# Patient Record
Sex: Female | Born: 1960
Health system: Southern US, Community
[De-identification: ages and names within clinical notes are randomized; demographics above are authoritative.]

## PROBLEM LIST (undated history)

## (undated) DIAGNOSIS — I10 Essential (primary) hypertension: Secondary | ICD-10-CM

## (undated) DIAGNOSIS — Z923 Personal history of irradiation: Secondary | ICD-10-CM

## (undated) DIAGNOSIS — C801 Malignant (primary) neoplasm, unspecified: Secondary | ICD-10-CM

## (undated) DIAGNOSIS — Z9221 Personal history of antineoplastic chemotherapy: Secondary | ICD-10-CM

## (undated) DIAGNOSIS — E119 Type 2 diabetes mellitus without complications: Secondary | ICD-10-CM

## (undated) HISTORY — DX: Type 2 diabetes mellitus without complications: E11.9

## (undated) HISTORY — DX: Malignant (primary) neoplasm, unspecified: C80.1

## (undated) HISTORY — DX: Essential (primary) hypertension: I10

---

## 2007-08-17 ENCOUNTER — Ambulatory Visit: Payer: Self-pay | Admitting: Unknown Physician Specialty

## 2007-08-18 ENCOUNTER — Ambulatory Visit: Payer: Self-pay | Admitting: Unknown Physician Specialty

## 2007-08-20 ENCOUNTER — Ambulatory Visit: Payer: Self-pay | Admitting: Oncology

## 2007-08-26 ENCOUNTER — Ambulatory Visit: Payer: Self-pay | Admitting: General Surgery

## 2007-08-29 ENCOUNTER — Ambulatory Visit: Payer: Self-pay | Admitting: Oncology

## 2007-09-19 ENCOUNTER — Ambulatory Visit: Payer: Self-pay | Admitting: Oncology

## 2007-10-20 HISTORY — PX: BREAST LUMPECTOMY: SHX2

## 2008-04-18 ENCOUNTER — Ambulatory Visit: Payer: Self-pay | Admitting: Oncology

## 2008-09-14 IMAGING — CR DG CHEST 2V
1 series · 2 of 2 positions shown · non-contrast
Comparison: none

REASON FOR EXAM: breast cancer
COMMENTS:

[Series 1: view not recorded · 0.17mm/px · 2 of 2 slices shown]
[im 1/2]
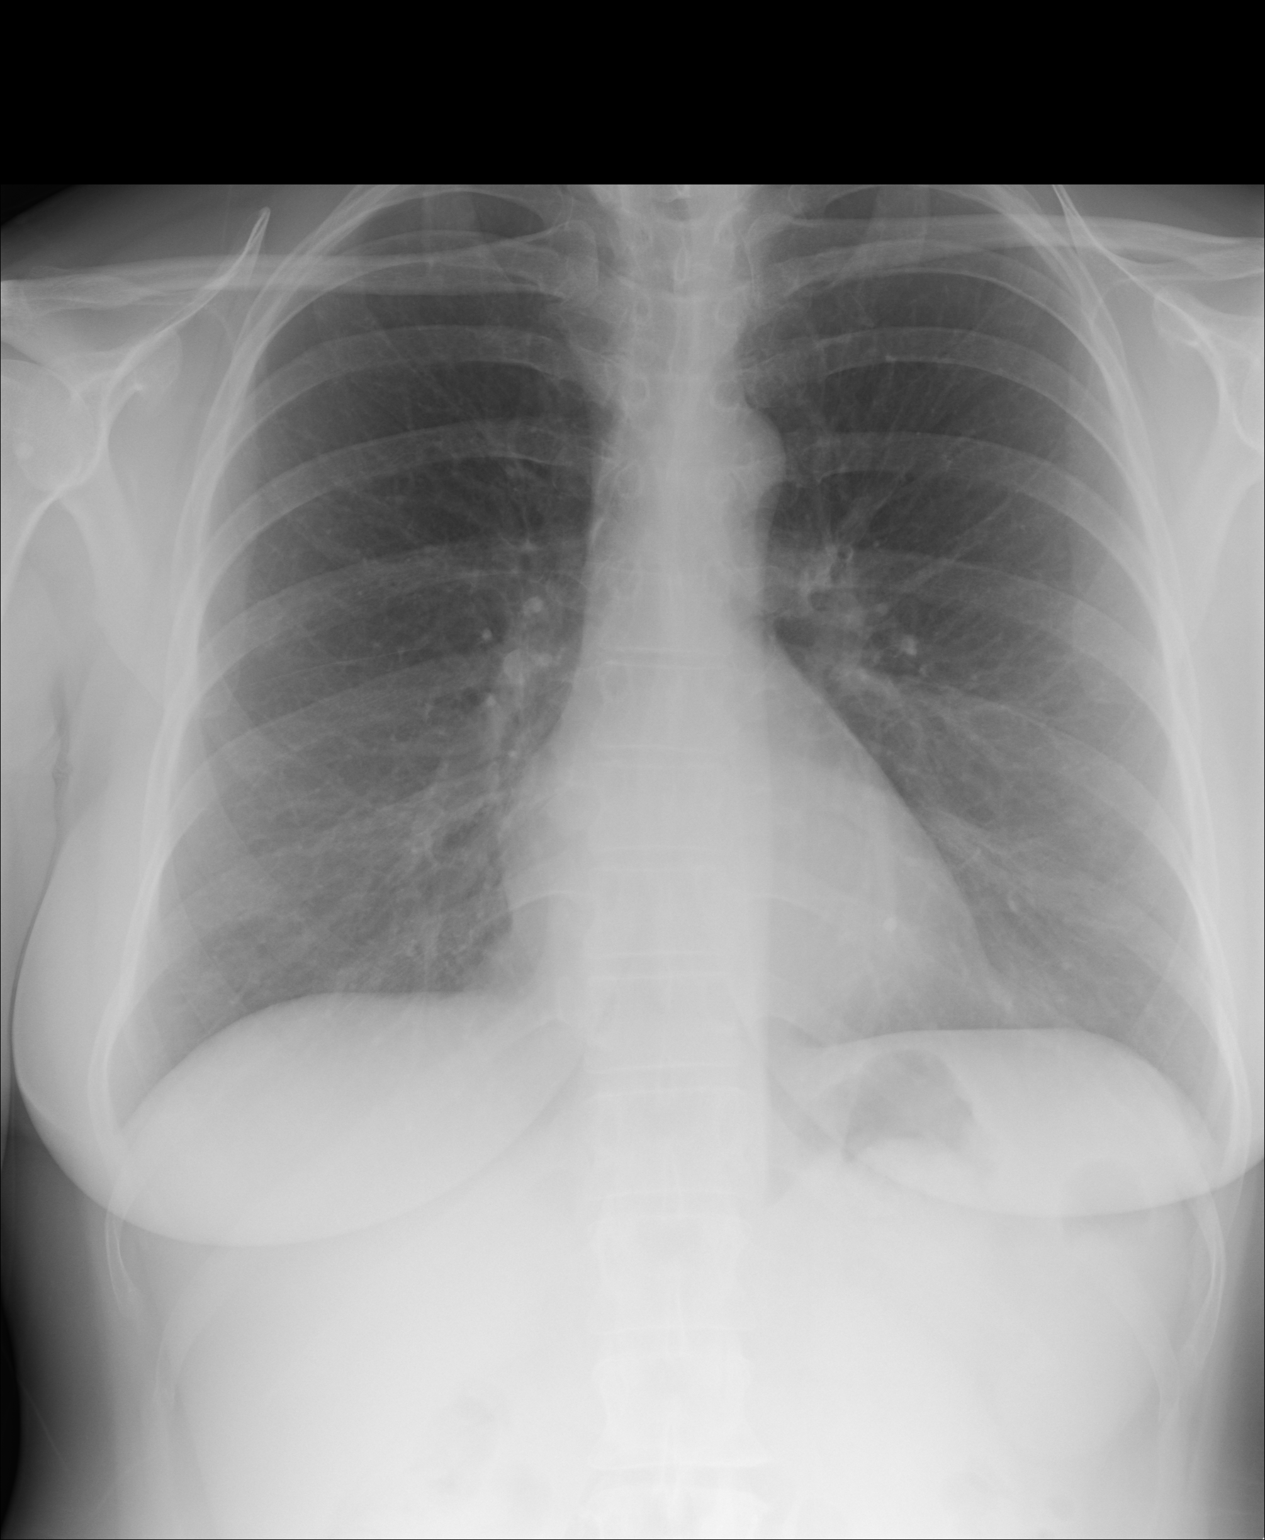
[im 2/2]
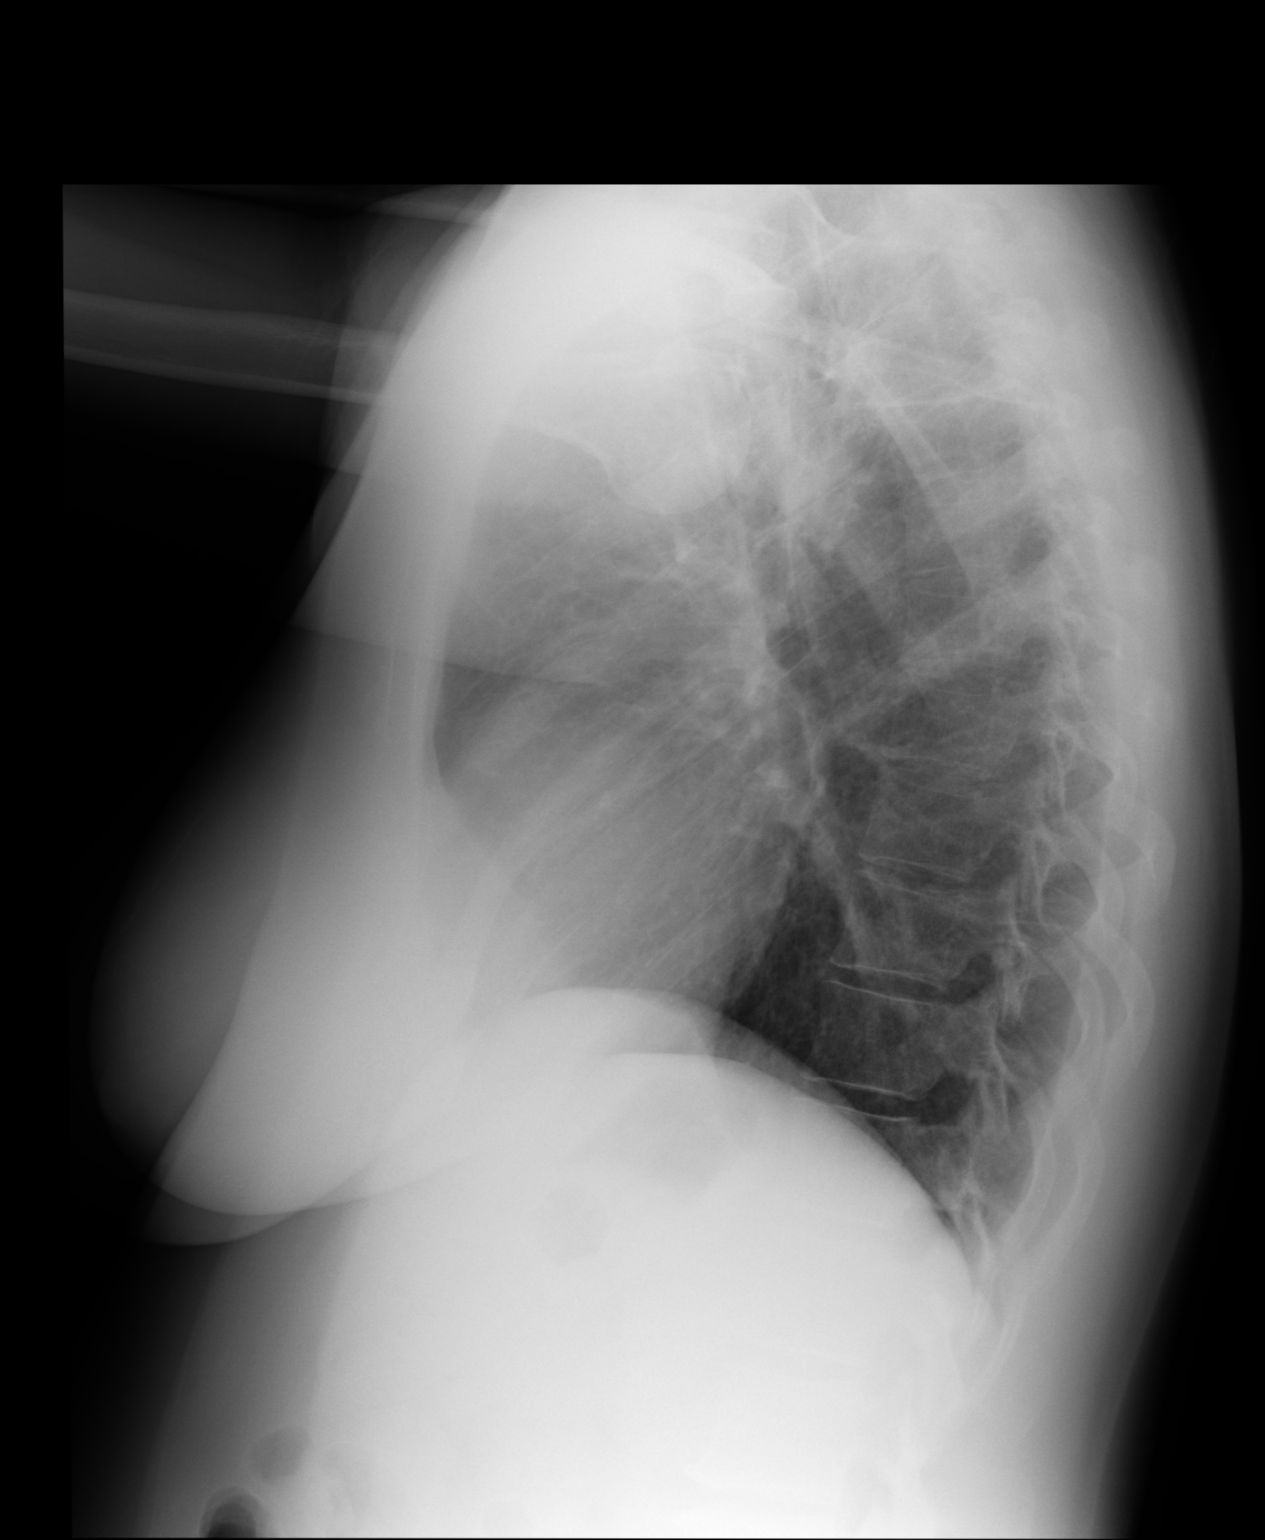

[2 of 2 positions shown; findings below may reference images not displayed]

PROCEDURE:     KDR - KDXR CHEST PA (OR AP) AND LAT  - August 26, 2007  [DATE]

RESULT:     There are no prior exams for comparison. The lungs are clear.
The heart and pulmonary vessels are normal. The bony and mediastinal
structures are unremarkable. There is no effusion. There is no pneumothorax
or evidence of congestive failure.
IMPRESSION: No acute cardiopulmonary disease.

## 2008-09-21 IMAGING — CT CT CHEST-ABD-PELV W/ CM
1 of 2 series · 14 of 29 positions shown, 19 images · non-contrast
Comparison: none

REASON FOR EXAM: Breast CA
COMMENTS:

[Series 2: soft tissue · axial · 0.77mm/px · z∈[-105,+480]mm · 14 of 133 slices shown, 19 images]
[im 8/133  mediastinal]
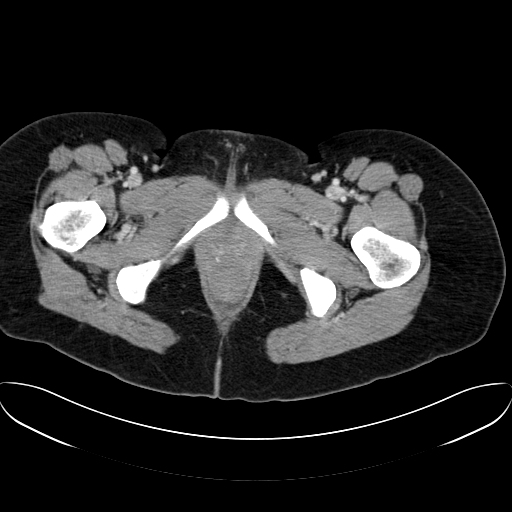
[im 8/133  bone]
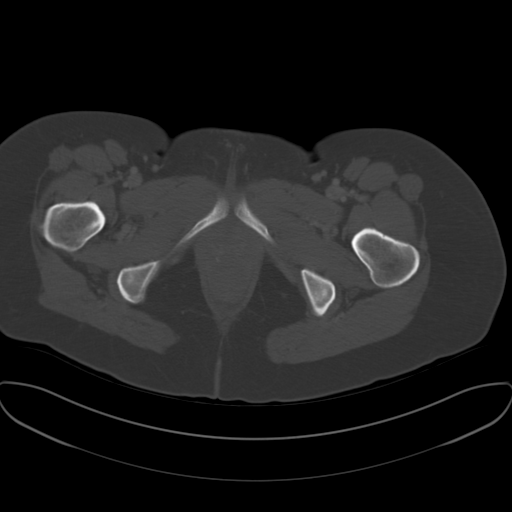
[im 23/133  mediastinal]
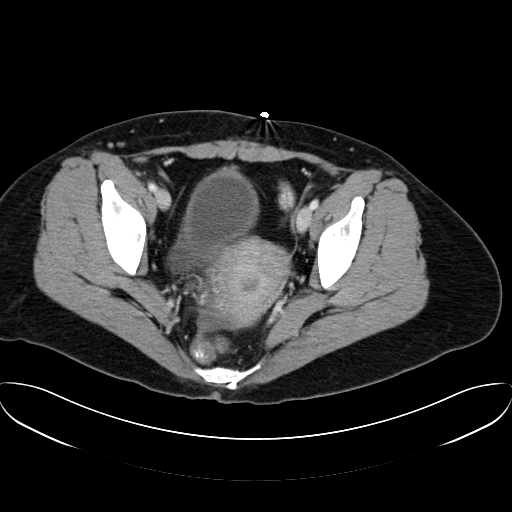
[im 30/133  mediastinal]
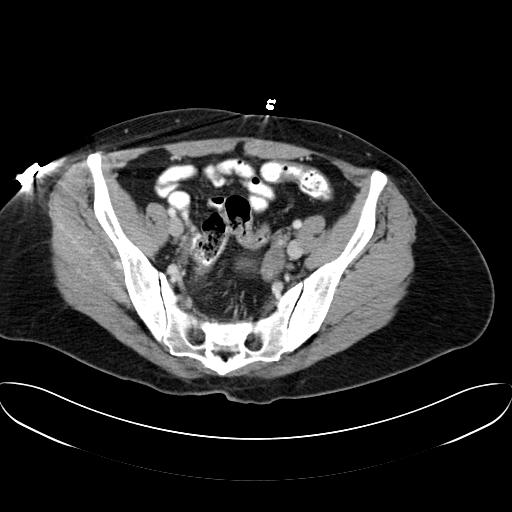
[im 37/133  mediastinal]
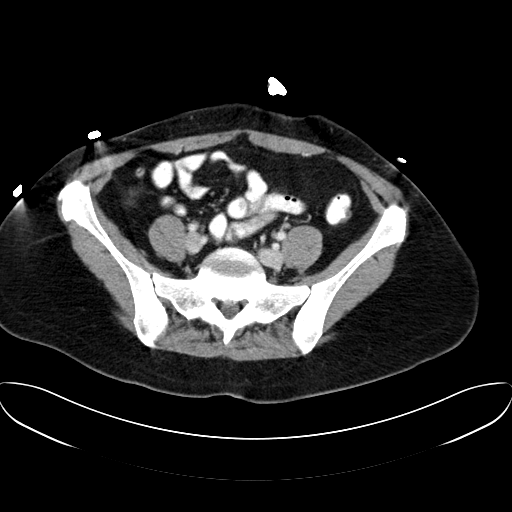
[im 52/133  mediastinal]
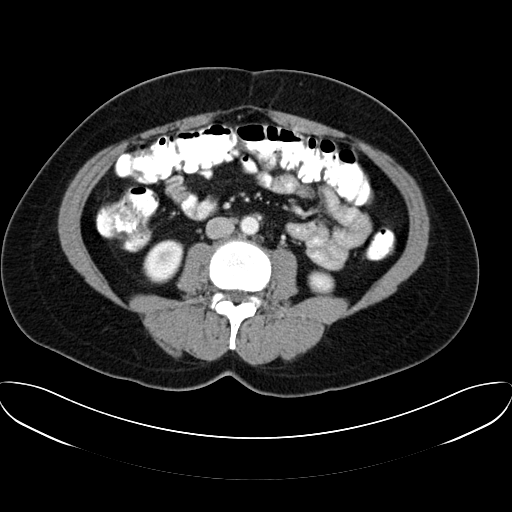
[im 59/133  mediastinal]
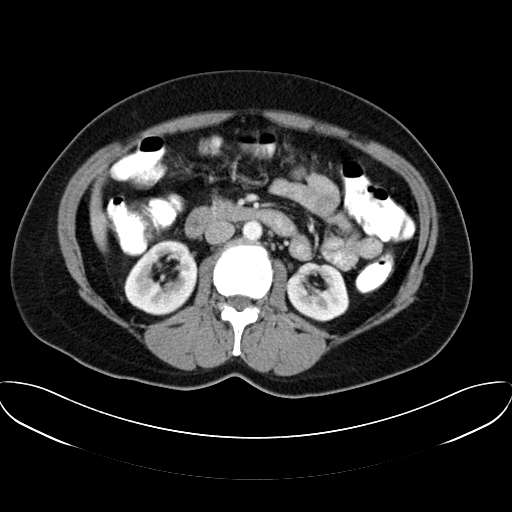
[im 67/133  mediastinal]
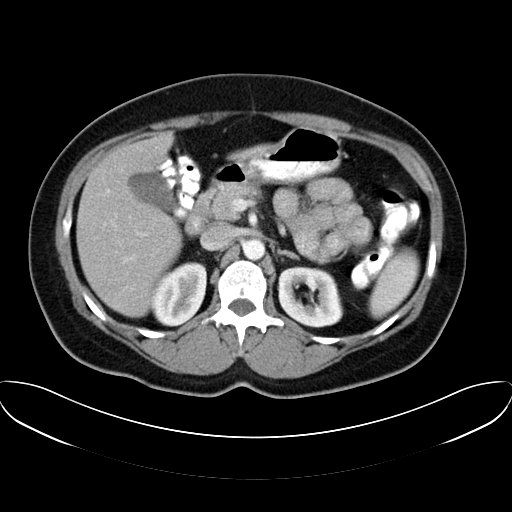
[im 74/133  mediastinal]
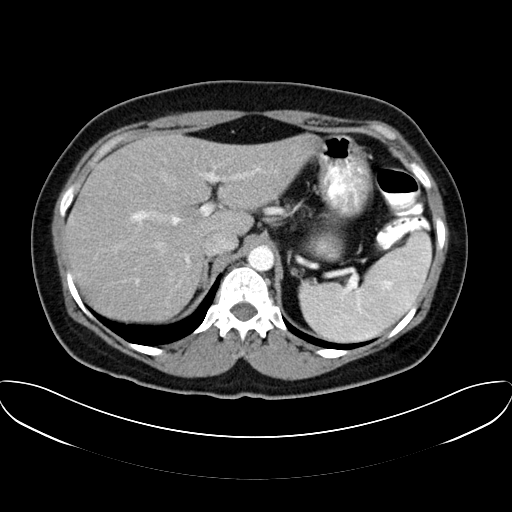
[im 81/133  mediastinal]
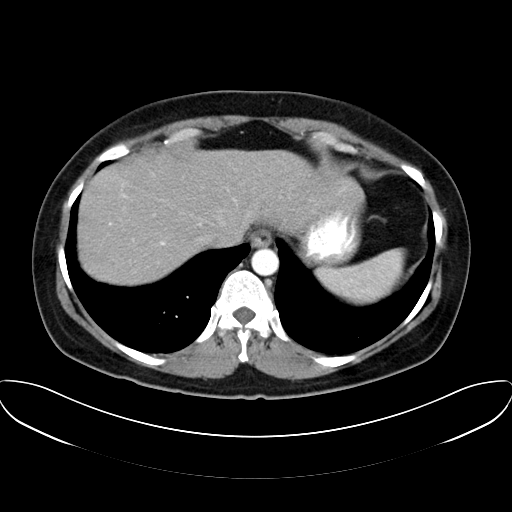
[im 81/133  bone]
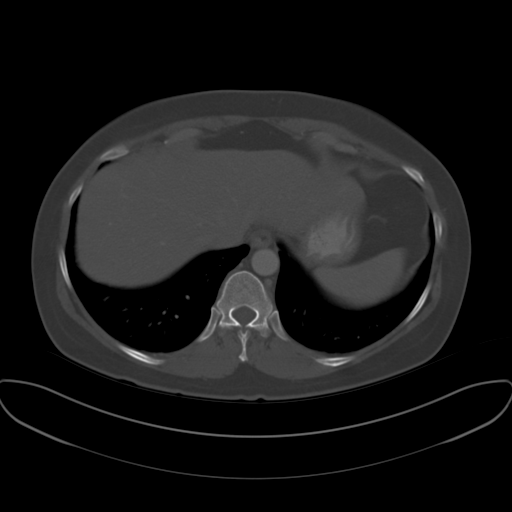
[im 96/133  mediastinal]
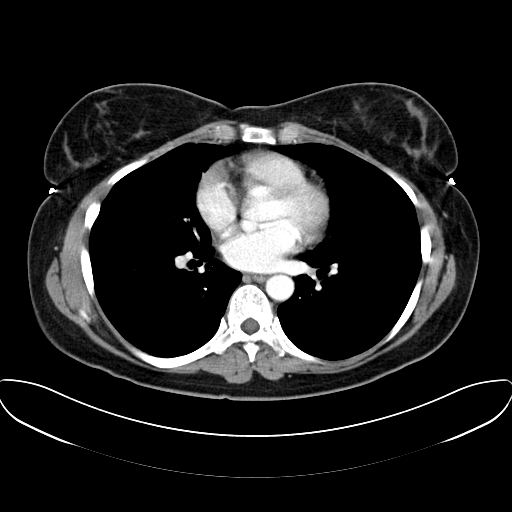
[im 103/133  mediastinal]
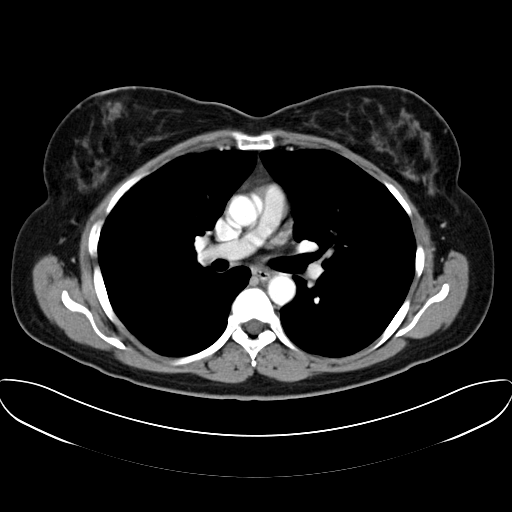
[im 103/133  lung]
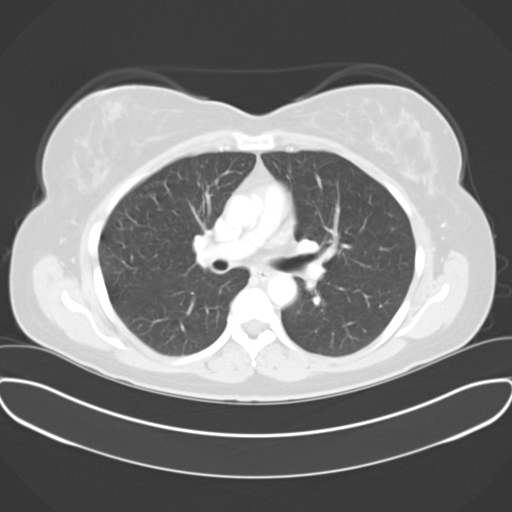
[im 111/133  mediastinal]
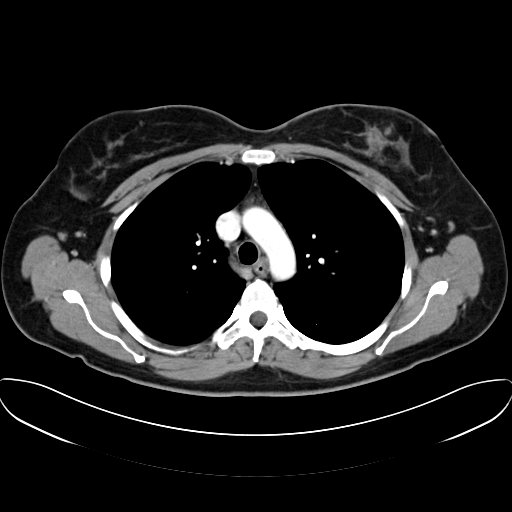
[im 111/133  lung]
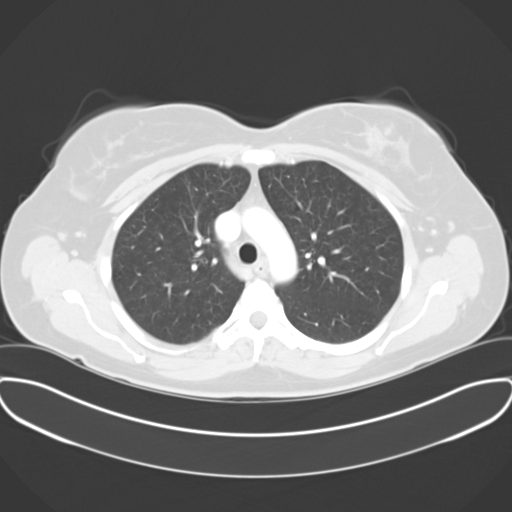
[im 118/133  lung]
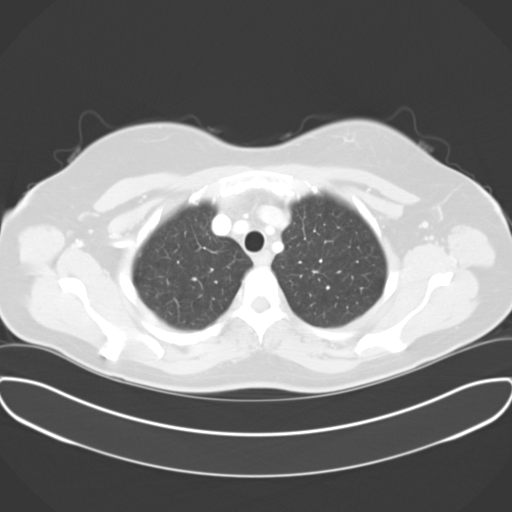
[im 125/133  mediastinal]
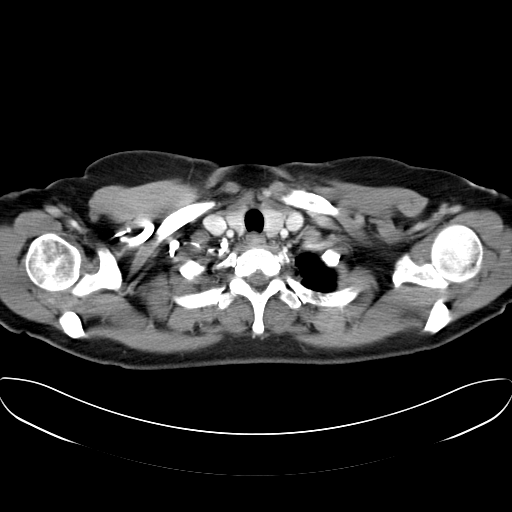
[im 125/133  lung]
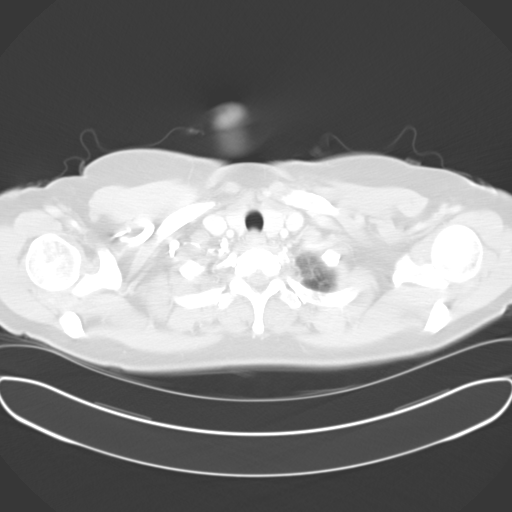

[14 of 29 positions shown; findings below may reference images not displayed]

PROCEDURE:     CT  - CT CHEST ABDOMEN AND PELVIS W  - September 02, 2007  [DATE]

RESULT:     CT of the chest, abdomen, and pelvis is performed utilizing 100
ml of Ksovue-1BU. Images are reconstructed at 5 mm slice thickness in the
axial plane. The patient has no prior exam for comparison.

There is nodular density in the left breast upper mid to medial aspect
measuring approximately 2.3 to 2.4 cm in diameter. The patient has known
breast cancer. This could be postoperative change or primary malignancy.
There is no axillary, mediastinal or hilar mass or adenopathy. There is no
pleural or pericardial effusion. There is no supraclavicular adenopathy. The
lungs are clear with no evidence of a focal pulmonary parenchymal mass.
There is no infiltrate or bronchial thickening. There is no pneumothorax.

There is no focal splenic lesion. The spleen is normal in size. There is a
focal area of low attenuation within the liver anteriorly on image #61
measuring approximately 5 mm and suggestive of a possible cyst. The liver
otherwise is unremarkable. There are no radiopaque gallstones evident. The
kidneys and adrenal glands appear to enhance normally with no masses
evident. The thoracic and abdominal aorta are normal in caliber. There is no
evidence of an abnormal area of bowel distention, free air or free fluid. No
fluid collection is present. The uterus is present and appears to be within
normal limits for size. The appendix is seen and appears normal. No
mesenteric or retroperitoneal adenopathy is evident.
IMPRESSION: 1. Probable hepatic cyst.
2. Density in the left breast as described. No definite metastatic disease
is demonstrated.

## 2008-10-19 HISTORY — PX: BREAST SURGERY: SHX581

## 2009-04-16 ENCOUNTER — Ambulatory Visit: Payer: Self-pay | Admitting: Internal Medicine

## 2010-07-15 ENCOUNTER — Ambulatory Visit: Payer: Self-pay | Admitting: Internal Medicine

## 2011-10-02 ENCOUNTER — Ambulatory Visit: Payer: Self-pay

## 2012-03-15 DIAGNOSIS — J310 Chronic rhinitis: Secondary | ICD-10-CM | POA: Insufficient documentation

## 2012-03-15 DIAGNOSIS — H7390 Unspecified disorder of tympanic membrane, unspecified ear: Secondary | ICD-10-CM | POA: Insufficient documentation

## 2012-03-15 DIAGNOSIS — H9190 Unspecified hearing loss, unspecified ear: Secondary | ICD-10-CM | POA: Insufficient documentation

## 2012-03-15 HISTORY — DX: Chronic rhinitis: J31.0

## 2012-03-15 HISTORY — DX: Unspecified disorder of tympanic membrane, unspecified ear: H73.90

## 2012-03-15 HISTORY — DX: Unspecified hearing loss, unspecified ear: H91.90

## 2012-04-25 DIAGNOSIS — H900 Conductive hearing loss, bilateral: Secondary | ICD-10-CM | POA: Insufficient documentation

## 2012-04-25 DIAGNOSIS — H8110 Benign paroxysmal vertigo, unspecified ear: Secondary | ICD-10-CM

## 2012-04-25 DIAGNOSIS — H6990 Unspecified Eustachian tube disorder, unspecified ear: Secondary | ICD-10-CM

## 2012-04-25 HISTORY — DX: Benign paroxysmal vertigo, unspecified ear: H81.10

## 2012-04-25 HISTORY — DX: Unspecified eustachian tube disorder, unspecified ear: H69.90

## 2012-04-25 HISTORY — DX: Conductive hearing loss, bilateral: H90.0

## 2012-06-17 DIAGNOSIS — C50912 Malignant neoplasm of unspecified site of left female breast: Secondary | ICD-10-CM

## 2012-06-17 HISTORY — DX: Malignant neoplasm of unspecified site of left female breast: C50.912

## 2012-11-15 DIAGNOSIS — C50919 Malignant neoplasm of unspecified site of unspecified female breast: Secondary | ICD-10-CM | POA: Insufficient documentation

## 2012-11-15 HISTORY — DX: Malignant neoplasm of unspecified site of unspecified female breast: C50.919

## 2012-11-18 DIAGNOSIS — L409 Psoriasis, unspecified: Secondary | ICD-10-CM

## 2012-11-18 HISTORY — DX: Psoriasis, unspecified: L40.9

## 2013-07-27 DIAGNOSIS — Z1371 Encounter for nonprocreative screening for genetic disease carrier status: Secondary | ICD-10-CM

## 2013-07-27 HISTORY — DX: Encounter for nonprocreative screening for genetic disease carrier status: Z13.71

## 2014-04-12 ENCOUNTER — Ambulatory Visit: Payer: Self-pay | Admitting: Internal Medicine

## 2014-04-18 ENCOUNTER — Ambulatory Visit: Payer: Self-pay | Admitting: Internal Medicine

## 2017-10-07 ENCOUNTER — Encounter: Payer: Self-pay | Admitting: Nurse Practitioner

## 2017-10-08 ENCOUNTER — Encounter: Payer: Self-pay | Admitting: Nurse Practitioner

## 2017-10-15 ENCOUNTER — Encounter: Payer: Self-pay | Admitting: Nurse Practitioner

## 2017-10-15 ENCOUNTER — Ambulatory Visit: Payer: 59 | Admitting: Nurse Practitioner

## 2017-10-15 VITALS — BP 130/86 | HR 91 | Resp 16 | Ht 66.0 in | Wt 158.2 lb

## 2017-10-15 DIAGNOSIS — Z9119 Patient's noncompliance with other medical treatment and regimen: Secondary | ICD-10-CM

## 2017-10-15 DIAGNOSIS — Z853 Personal history of malignant neoplasm of breast: Secondary | ICD-10-CM

## 2017-10-15 DIAGNOSIS — M545 Low back pain, unspecified: Secondary | ICD-10-CM | POA: Insufficient documentation

## 2017-10-15 DIAGNOSIS — R197 Diarrhea, unspecified: Secondary | ICD-10-CM | POA: Insufficient documentation

## 2017-10-15 DIAGNOSIS — R5383 Other fatigue: Secondary | ICD-10-CM | POA: Insufficient documentation

## 2017-10-15 DIAGNOSIS — E113213 Type 2 diabetes mellitus with mild nonproliferative diabetic retinopathy with macular edema, bilateral: Secondary | ICD-10-CM

## 2017-10-15 DIAGNOSIS — E785 Hyperlipidemia, unspecified: Secondary | ICD-10-CM

## 2017-10-15 DIAGNOSIS — E1165 Type 2 diabetes mellitus with hyperglycemia: Secondary | ICD-10-CM | POA: Diagnosis not present

## 2017-10-15 DIAGNOSIS — I10 Essential (primary) hypertension: Secondary | ICD-10-CM | POA: Insufficient documentation

## 2017-10-15 DIAGNOSIS — Z91199 Patient's noncompliance with other medical treatment and regimen due to unspecified reason: Secondary | ICD-10-CM | POA: Insufficient documentation

## 2017-10-15 LAB — POCT GLYCOSYLATED HEMOGLOBIN (HGB A1C): HEMOGLOBIN A1C: 12.6

## 2017-10-15 MED ORDER — ROSUVASTATIN CALCIUM 5 MG PO TABS: 5.0000 mg | ORAL_TABLET | Freq: Every day | ORAL | 5 refills | Status: DC

## 2017-10-15 MED ORDER — INSULIN DETEMIR 100 UNIT/ML FLEXPEN: 12.0000 [IU] | PEN_INJECTOR | Freq: Every day | SUBCUTANEOUS | 5 refills | Status: DC

## 2017-10-15 MED ORDER — INSULIN PEN NEEDLE 32G X 4 MM MISC: 1.0000 | Freq: Three times a day (TID) | 5 refills | Status: DC

## 2017-10-15 MED ORDER — LIRAGLUTIDE 18 MG/3ML ~~LOC~~ SOPN: 1.8000 mg | PEN_INJECTOR | Freq: Every day | SUBCUTANEOUS | 5 refills | Status: DC

## 2017-10-15 MED ORDER — FREESTYLE LIBRE 14 DAY READER DEVI: 1.0000 [IU] | Freq: Every day | 5 refills | Status: DC

## 2017-10-15 NOTE — Progress Notes (Signed)
Subjective:     Patient ID: Kristie Bowman, female   DOB: October 17, 1961, 56 y.o.   MRN: [id number]  Patient is here for routine follow up visit. She has very elevated blood sugars. Has not been taking medications as she should. Did ok for a little while earlier in the year, but then go very busy with her husband's health issues. Has been off meds for several weeks, possibly longer than a month. Continues to have weight loss and significant fatigue. Has not had labs drawn yet, but plans to do that prior to her annual physical in February.     Current Outpatient Medications:  .  Insulin Detemir (LEVEMIR FLEXTOUCH) 100 UNIT/ML Pen, Inject 12 Units into the skin daily after supper., Disp: 3 pen, Rfl: 5 .  liraglutide (VICTOZA) 18 MG/3ML SOPN, Inject 0.3 mLs (1.8 mg total) into the skin daily., Disp: 5 pen, Rfl: 5 .  rosuvastatin (CRESTOR) 5 MG tablet, Take 1 tablet (5 mg total) by mouth at bedtime., Disp: 30 tablet, Rfl: 5 .  Continuous Blood Gluc Receiver (FREESTYLE LIBRE 14 DAY READER) DEVI, 1 Units by Does not apply route daily., Disp: 2 Device, Rfl: 5 .  Insulin Pen Needle (FIFTY50 PEN NEEDLES) 32G X 4 MM MISC, 1 Package by Does not apply route 3 (three) times daily., Disp: 100 each, Rfl: 5  Review of Systems  Constitutional: Positive for fatigue and unexpected weight change.  HENT: Positive for postnasal drip and voice change.   Eyes: Positive for visual disturbance.  Respiratory: Positive for cough.   Gastrointestinal: Negative.   Endocrine:       Blood sugars severely elevated   Musculoskeletal: Negative.   Skin: Negative.   Allergic/Immunologic: Positive for environmental allergies.  Neurological: Negative.   Hematological: Negative.   Psychiatric/Behavioral: Negative.    Today's Vitals   10/15/17 0912  BP: 130/86  Pulse: 91  Resp: 16  SpO2: 97%  Weight: 158 lb 3.2 oz (71.8 kg)  Height: 5\' 6"  (1.676 m)      Objective:   Physical Exam  Constitutional: She is oriented to  person, place, and time. She appears well-developed and well-nourished.  HENT:  Head: Normocephalic and atraumatic.  Eyes: Pupils are equal, round, and reactive to light.  Neck: Normal range of motion. Neck supple. No thyromegaly present.  Cardiovascular: Normal rate and regular rhythm.  Pulmonary/Chest: Effort normal and breath sounds normal. She has no wheezes.  Abdominal: Soft. Bowel sounds are normal. There is no tenderness.  Musculoskeletal: Normal range of motion.  Neurological: She is alert and oriented to person, place, and time.  Skin: Skin is warm and dry.  Psychiatric: She has a normal mood and affect.  Nursing note and vitals reviewed.      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, unspecified whether long term insulin use (HCC)  Type 2 diabetes mellitus with hyperglycemia, unspecified whether long term insulin use (HCC) - Plan: POCT HgB A1C, Insulin Detemir (LEVEMIR FLEXTOUCH) 100 UNIT/ML Pen, liraglutide (VICTOZA) 18 MG/3ML SOPN, Insulin Pen Needle (FIFTY50 PEN NEEDLES) 32G X 4 MM MISC  Essential (primary) hypertension  Patient's noncompliance with other medical treatment and regimen  Personal history of malignant neoplasm of breast  Hyperlipidemia, unspecified hyperlipidemia type - Plan: rosuvastatin (CRESTOR) 5 MG tablet     Plan:     1.DM2 with mild proliferative retinopathy - recent eye exam not showing evidence of retinopathy. HgbA1c is 12.7 today.  Long disucssion regarding compliance with medications as well as risk factors of continuing to have unctrolled type 2 diabetes. Reviewed meds and new prescriptions sent to pharmacy. Will order [facility] for continuous glucose monitoring. Reviewed AD diet.  2. htn - generally well controlled without meds. Will need to discuss addition of ACE at next visit 3. Fating lipid panel to be done prior to next visit. Adjust crestor as idicated.    Annual physical as scheduled. She should also follow up as needed

## 2017-11-12 DIAGNOSIS — E119 Type 2 diabetes mellitus without complications: Secondary | ICD-10-CM | POA: Diagnosis not present

## 2017-11-12 DIAGNOSIS — E782 Mixed hyperlipidemia: Secondary | ICD-10-CM | POA: Diagnosis not present

## 2017-11-12 DIAGNOSIS — I10 Essential (primary) hypertension: Secondary | ICD-10-CM | POA: Diagnosis not present

## 2017-11-12 DIAGNOSIS — Z0001 Encounter for general adult medical examination with abnormal findings: Secondary | ICD-10-CM | POA: Diagnosis not present

## 2017-11-19 ENCOUNTER — Other Ambulatory Visit: Payer: Self-pay

## 2017-11-19 ENCOUNTER — Encounter: Payer: Self-pay | Admitting: Nurse Practitioner

## 2017-11-19 ENCOUNTER — Ambulatory Visit (INDEPENDENT_AMBULATORY_CARE_PROVIDER_SITE_OTHER): Payer: BLUE CROSS/BLUE SHIELD | Admitting: Nurse Practitioner

## 2017-11-19 VITALS — BP 133/89 | HR 72 | Resp 16 | Ht 66.0 in | Wt 162.2 lb

## 2017-11-19 DIAGNOSIS — Z853 Personal history of malignant neoplasm of breast: Secondary | ICD-10-CM

## 2017-11-19 DIAGNOSIS — E1165 Type 2 diabetes mellitus with hyperglycemia: Secondary | ICD-10-CM | POA: Diagnosis not present

## 2017-11-19 DIAGNOSIS — R300 Dysuria: Secondary | ICD-10-CM | POA: Diagnosis not present

## 2017-11-19 DIAGNOSIS — E785 Hyperlipidemia, unspecified: Secondary | ICD-10-CM

## 2017-11-19 DIAGNOSIS — Z0001 Encounter for general adult medical examination with abnormal findings: Secondary | ICD-10-CM | POA: Diagnosis not present

## 2017-11-19 DIAGNOSIS — I10 Essential (primary) hypertension: Secondary | ICD-10-CM

## 2017-11-19 MED ORDER — ROSUVASTATIN CALCIUM 5 MG PO TABS: 5.0000 mg | ORAL_TABLET | Freq: Every day | ORAL | 4 refills | Status: DC

## 2017-11-19 MED ORDER — INSULIN PEN NEEDLE 32G X 4 MM MISC: 1.0000 | Freq: Three times a day (TID) | 4 refills | Status: DC

## 2017-11-19 MED ORDER — LISINOPRIL 5 MG PO TABS: 5.0000 mg | ORAL_TABLET | Freq: Every day | ORAL | 4 refills | Status: DC

## 2017-11-19 MED ORDER — GLUCOSE BLOOD VI STRP: ORAL_STRIP | 4 refills | Status: DC

## 2017-11-19 MED ORDER — ACCU-CHEK SAFE-T PRO LANCETS MISC: 4 refills | Status: DC

## 2017-11-19 MED ORDER — INSULIN DETEMIR 100 UNIT/ML FLEXPEN: 40.0000 [IU] | PEN_INJECTOR | Freq: Every day | SUBCUTANEOUS | 5 refills | Status: DC

## 2017-11-19 MED ORDER — LIRAGLUTIDE 18 MG/3ML ~~LOC~~ SOPN: 1.8000 mg | PEN_INJECTOR | Freq: Every day | SUBCUTANEOUS | 5 refills | Status: DC

## 2017-11-19 NOTE — Progress Notes (Signed)
[facility] [place], [place] [address]  Internal MEDICINE  Office Visit Note  Patient Name: Kristie Bowman  [id number]  [id number]  Date of Service: 12/01/2017  Chief Complaint  Patient presents with  . Diabetes  . Hyperlipidemia     Diabetes  She presents for her follow-up diabetic visit. She has type 2 diabetes mellitus. Her disease course has been worsening. Hypoglycemia symptoms include headaches. Pertinent negatives for hypoglycemia include no nervousness/anxiousness. Associated symptoms include blurred vision, fatigue, polydipsia, polyuria and weight loss. Pertinent negatives for diabetes include no chest pain and no polyphagia. There are no hypoglycemic complications. Symptoms are worsening. There are no diabetic complications. Risk factors for coronary artery disease include dyslipidemia and hypertension. Current diabetic treatment includes insulin injections. She is compliant with treatment some of the time. Her weight is decreasing steadily. She is following a generally healthy diet. Meal planning includes ADA exchanges. She has had a previous visit with a dietitian. She participates in exercise intermittently. Her home blood glucose trend is fluctuating dramatically. An ACE inhibitor/angiotensin II receptor blocker is being taken. She does not see a podiatrist.Eye exam is current.  Hyperlipidemia  This is a chronic problem. The current episode started more than 1 year ago. The problem is resistant. Recent lipid tests were reviewed and are high. Pertinent negatives include no chest pain. Current antihyperlipidemic treatment includes statins. The current treatment provides no improvement of lipids. Compliance problems: adherence to medication.  Risk factors for coronary artery disease include diabetes mellitus, dyslipidemia, hypertension and family history.   Pt is here for routine health maintenance examination  Current Medication: Outpatient Encounter  Medications as of 11/19/2017  Medication Sig  . lisinopril (PRINIVIL,ZESTRIL) 5 MG tablet Take 1 tablet (5 mg total) by mouth daily. Please add on patient's med list  . [DISCONTINUED] lisinopril (PRINIVIL,ZESTRIL) 5 MG tablet Take 5 mg by mouth daily.  . Continuous Blood Gluc Receiver (FREESTYLE LIBRE 14 DAY READER) DEVI 1 Units by Does not apply route daily.  Marland Kitchen glucose blood (ACCU-CHEK GUIDE) test strip Blood sugar testing TID - dx E11.65  . Insulin Detemir (LEVEMIR FLEXTOUCH) 100 UNIT/ML Pen Inject 40 Units into the skin daily after supper.  . Insulin Pen Needle (FIFTY50 PEN NEEDLES) 32G X 4 MM MISC 1 Package by Does not apply route 3 (three) times daily.  . Lancets (ACCU-CHEK SAFE-T PRO) lancets Blood sugar testing done 2 times daily - E11.65  . liraglutide (VICTOZA) 18 MG/3ML SOPN Inject 0.3 mLs (1.8 mg total) into the skin daily. Please add to patient's med list.  . rosuvastatin (CRESTOR) 5 MG tablet Take 1 tablet (5 mg total) by mouth at bedtime.  . [DISCONTINUED] Insulin Detemir (LEVEMIR FLEXTOUCH) 100 UNIT/ML Pen Inject 12 Units into the skin daily after supper.  . [DISCONTINUED] Insulin Pen Needle (FIFTY50 PEN NEEDLES) 32G X 4 MM MISC 1 Package by Does not apply route 3 (three) times daily.  . [DISCONTINUED] liraglutide (VICTOZA) 18 MG/3ML SOPN Inject 0.3 mLs (1.8 mg total) into the skin daily.  . [DISCONTINUED] rosuvastatin (CRESTOR) 5 MG tablet Take 1 tablet (5 mg total) by mouth at bedtime.   No facility-administered encounter medications on file as of 11/19/2017.     Surgical History: Past Surgical History:  Procedure Laterality Date  . BREAST SURGERY  2010   left breast tumor removal    Medical History: Past Medical History:  Diagnosis Date  . Cancer [facility])    left breast s/p left lumpectomy  . Diabetes  mellitus without complication ([place])   . Hypertension     Family History: No family history on file.    Review of Systems  Constitutional: Positive for fatigue,  unexpected weight change and weight loss.  HENT: Negative for postnasal drip, rhinorrhea, sinus pressure and voice change.   Eyes: Positive for blurred vision.  Respiratory: Negative for cough, chest tightness and wheezing.   Cardiovascular: Negative for chest pain and palpitations.  Gastrointestinal: Positive for diarrhea.  Endocrine: Positive for polydipsia and polyuria. Negative for polyphagia.  Genitourinary: Positive for frequency.  Musculoskeletal: Negative.   Skin: Negative.   Allergic/Immunologic: Negative for food allergies and immunocompromised state.  Neurological: Positive for headaches.  Hematological: Negative.   Psychiatric/Behavioral: Negative for behavioral problems. The patient is not nervous/anxious.      Today's Vitals   11/19/17 1512  BP: 133/89  Pulse: 72  Resp: 16  SpO2: 99%  Weight: 162 lb 3.2 oz (73.6 kg)  Height: 5\' 6"  (1.676 m)    Physical Exam  Constitutional: She is oriented to person, place, and time. She appears well-developed and well-nourished. No distress.  HENT:  Head: Normocephalic and atraumatic.  Mouth/Throat: Oropharynx is clear and moist. No oropharyngeal exudate.  Eyes: EOM are normal. Pupils are equal, round, and reactive to light.  Neck: Normal range of motion. Neck supple. No JVD present. No tracheal deviation present. No thyromegaly present.  Cardiovascular: Normal rate, regular rhythm and normal heart sounds. Exam reveals no gallop and no friction rub.  No murmur heard. Pulmonary/Chest: Effort normal and breath sounds normal. No respiratory distress. She has no wheezes. She has no rales. She exhibits no tenderness.  Abdominal: Soft. Bowel sounds are normal. There is no tenderness.  Musculoskeletal: Normal range of motion.  Lymphadenopathy:    She has no cervical adenopathy.  Neurological: She is alert and oriented to person, place, and time. No cranial nerve deficit.  Skin: Skin is warm and dry. She is not diaphoretic.   Psychiatric: She has a normal mood and affect. Her behavior is normal. Judgment and thought content normal.  Nursing note and vitals reviewed.    LABS: Recent Results (from the past 2160 hour(s))  POCT HgB A1C     Status: Abnormal   Collection Time: 10/15/17  9:36 AM  Result Value Ref Range   Hemoglobin A1C 12.6   Urinalysis, Routine w reflex microscopic     Status: Abnormal   Collection Time: 11/19/17  4:17 PM  Result Value Ref Range   Specific Gravity, UA      >=1.030 (A) 1.005 - 1.030   pH, UA 5.5 5.0 - 7.5   Color, UA Yellow Yellow   Appearance Ur Clear Clear   Leukocytes, UA Negative Negative   Protein, UA Negative Negative/Trace   Glucose, UA 3+ (A) Negative   Ketones, UA Negative Negative   RBC, UA Negative Negative   Bilirubin, UA Negative Negative   Urobilinogen, Ur 0.2 0.2 - 1.0 mg/dL   Nitrite, UA Negative Negative   Microscopic Examination Comment     Comment: Microscopic not indicated and not performed.    Assessment/Plan:  1. Encounter for general adult medical examination with abnormal findings Annual health maintenance exam today  2. Type 2 diabetes mellitus with hyperglycemia, unspecified whether long term insulin use (HCC) - Insulin Detemir (LEVEMIR FLEXTOUCH) 100 UNIT/ML Pen; Inject 40 Units into the skin daily after supper.  Dispense: 9 pen; Refill: 5 - liraglutide (VICTOZA) 18 MG/3ML SOPN; Inject 0.3 mLs (1.8 mg total) into  the skin daily. Please add to patient's med list.  Dispense: 9 pen; Refill: 5 - glucose blood (ACCU-CHEK GUIDE) test strip; Blood sugar testing TID - dx E11.65  Dispense: 360 each; Refill: 4 - Lancets (ACCU-CHEK SAFE-T PRO) lancets; Blood sugar testing done 2 times daily - E11.65  Dispense: 360 each; Refill: 4 - Insulin Pen Needle (FIFTY50 PEN NEEDLES) 32G X 4 MM MISC; 1 Package by Does not apply route 3 (three) times daily.  Dispense: 360 each; Refill: 4  3. Hyperlipidemia, unspecified hyperlipidemia type - rosuvastatin (CRESTOR)  5 MG tablet; Take 1 tablet (5 mg total) by mouth at bedtime.  Dispense: 90 tablet; Refill: 4  4. Essential (primary) hypertension stable - lisinopril (PRINIVIL,ZESTRIL) 5 MG tablet; Take 1 tablet (5 mg total) by mouth daily. Please add on patient's med list  Dispense: 90 tablet; Refill: 4  5. Dysuria - Urinalysis, Routine w reflex microscopic  6. Personal history of malignant neoplasm of breast Continue regular visits with oncology/surgery as scheduled. .  General Counseling: Elise Benne understanding of the findings of todays visit and agrees with plan of treatment. I have discussed any further diagnostic evaluation that may be needed or ordered today. We also reviewed her medications today. she has been encouraged to call the office with any questions or concerns that should arise related to todays visit.  Diabetes Counseling:  1. Addition of ACE inh/ ARB'S for nephroprotection. 2. Diabetic foot care, prevention of complications.  3.Exercise and lose weight.  4. Diabetic eye examination, 5. Monitor blood sugar closlely. nutrition counseling.  6.Sign and symptoms of hypoglycemia including shaking sweating,confusion and headaches.   This patient was seen by Leretha Pol, FNP- C in Collaboration with Dr Lavera Guise as a part of collaborative care agreement    Orders Placed This Encounter  Procedures  . Urinalysis, Routine w reflex microscopic    Meds ordered this encounter  Medications  . rosuvastatin (CRESTOR) 5 MG tablet    Sig: Take 1 tablet (5 mg total) by mouth at bedtime.    Dispense:  90 tablet    Refill:  4    Please add to patient's med list    Order Specific Question:   Supervising Provider    Answer:   Lavera Guise [[id number]]  . Insulin Detemir (LEVEMIR FLEXTOUCH) 100 UNIT/ML Pen    Sig: Inject 40 Units into the skin daily after supper.    Dispense:  9 pen    Refill:  5    Please file on patient's med list.    Order Specific Question:   Supervising Provider     Answer:   Lavera Guise [[id number]]  . liraglutide (VICTOZA) 18 MG/3ML SOPN    Sig: Inject 0.3 mLs (1.8 mg total) into the skin daily. Please add to patient's med list.    Dispense:  9 pen    Refill:  5    Order Specific Question:   Supervising Provider    Answer:   Lavera Guise [[id number]]  . lisinopril (PRINIVIL,ZESTRIL) 5 MG tablet    Sig: Take 1 tablet (5 mg total) by mouth daily. Please add on patient's med list    Dispense:  90 tablet    Refill:  4    Order Specific Question:   Supervising Provider    Answer:   Lavera Guise [[id number]]  . glucose blood (ACCU-CHEK GUIDE) test strip    Sig: Blood sugar testing TID - dx E11.65    Dispense:  360  each    Refill:  4    Order Specific Question:   Supervising Provider    Answer:   Lavera Guise [[id number]]  . Lancets (ACCU-CHEK SAFE-T PRO) lancets    Sig: Blood sugar testing done 2 times daily - E11.65    Dispense:  360 each    Refill:  4    Order Specific Question:   Supervising Provider    Answer:   Lavera Guise [place]  . Insulin Pen Needle (FIFTY50 PEN NEEDLES) 32G X 4 MM MISC    Sig: 1 Package by Does not apply route 3 (three) times daily.    Dispense:  360 each    Refill:  4    Please add to patient's med list    Order Specific Question:   Supervising Provider    Answer:   Lavera Guise [[id number]]    Time spent: [place], MD  Internal Medicine

## 2017-11-20 LAB — URINALYSIS, ROUTINE W REFLEX MICROSCOPIC
Bilirubin, UA: NEGATIVE
KETONES UA: NEGATIVE
Leukocytes, UA: NEGATIVE
Nitrite, UA: NEGATIVE
Protein, UA: NEGATIVE
RBC, UA: NEGATIVE
Urobilinogen, Ur: 0.2 mg/dL (ref 0.2–1.0)
pH, UA: 5.5 (ref 5.0–7.5)

## 2017-12-16 DIAGNOSIS — C50912 Malignant neoplasm of unspecified site of left female breast: Secondary | ICD-10-CM | POA: Diagnosis not present

## 2017-12-16 DIAGNOSIS — Z1231 Encounter for screening mammogram for malignant neoplasm of breast: Secondary | ICD-10-CM | POA: Diagnosis not present

## 2017-12-16 DIAGNOSIS — Z171 Estrogen receptor negative status [ER-]: Secondary | ICD-10-CM | POA: Diagnosis not present

## 2018-01-18 ENCOUNTER — Ambulatory Visit: Payer: Self-pay | Admitting: Nurse Practitioner

## 2018-04-06 DIAGNOSIS — E1165 Type 2 diabetes mellitus with hyperglycemia: Secondary | ICD-10-CM | POA: Diagnosis not present

## 2018-04-06 DIAGNOSIS — H401132 Primary open-angle glaucoma, bilateral, moderate stage: Secondary | ICD-10-CM | POA: Diagnosis not present

## 2018-07-11 ENCOUNTER — Ambulatory Visit (INDEPENDENT_AMBULATORY_CARE_PROVIDER_SITE_OTHER): Payer: BLUE CROSS/BLUE SHIELD | Admitting: Nurse Practitioner

## 2018-07-11 ENCOUNTER — Encounter: Payer: Self-pay | Admitting: Nurse Practitioner

## 2018-07-11 VITALS — BP 136/90 | HR 72 | Resp 16 | Ht 65.0 in | Wt 153.8 lb

## 2018-07-11 DIAGNOSIS — Z23 Encounter for immunization: Secondary | ICD-10-CM | POA: Diagnosis not present

## 2018-07-11 DIAGNOSIS — E785 Hyperlipidemia, unspecified: Secondary | ICD-10-CM

## 2018-07-11 DIAGNOSIS — I10 Essential (primary) hypertension: Secondary | ICD-10-CM | POA: Diagnosis not present

## 2018-07-11 DIAGNOSIS — E1165 Type 2 diabetes mellitus with hyperglycemia: Secondary | ICD-10-CM | POA: Diagnosis not present

## 2018-07-11 DIAGNOSIS — Z1211 Encounter for screening for malignant neoplasm of colon: Secondary | ICD-10-CM

## 2018-07-11 LAB — POCT GLYCOSYLATED HEMOGLOBIN (HGB A1C): HEMOGLOBIN A1C: 10.1 % — AB (ref 4.0–5.6)

## 2018-07-11 MED ORDER — GLUCOSE BLOOD VI STRP: ORAL_STRIP | 4 refills | Status: DC

## 2018-07-11 MED ORDER — INSULIN PEN NEEDLE 32G X 4 MM MISC: 1.0000 | Freq: Three times a day (TID) | 4 refills | Status: DC

## 2018-07-11 MED ORDER — LISINOPRIL 5 MG PO TABS: 5.0000 mg | ORAL_TABLET | Freq: Every day | ORAL | 4 refills | Status: DC

## 2018-07-11 MED ORDER — LIRAGLUTIDE 18 MG/3ML ~~LOC~~ SOPN: 1.8000 mg | PEN_INJECTOR | Freq: Every day | SUBCUTANEOUS | 5 refills | Status: DC

## 2018-07-11 MED ORDER — ROSUVASTATIN CALCIUM 5 MG PO TABS: 5.0000 mg | ORAL_TABLET | Freq: Every day | ORAL | 4 refills | Status: DC

## 2018-07-11 MED ORDER — INSULIN DETEMIR 100 UNIT/ML FLEXPEN: 40.0000 [IU] | PEN_INJECTOR | Freq: Every day | SUBCUTANEOUS | 5 refills | Status: DC

## 2018-07-11 NOTE — Progress Notes (Signed)
[facility] [place], [place] [address]  Internal MEDICINE  Office Visit Note  Patient Name: Kristie Bowman  [id number]  [id number]  Date of Service: 07/18/2018  Chief Complaint  Patient presents with  . Diabetes    2 month follow up    The patient is prescribed 40 units levemir every day. States that she does not take it everyday and when she does, she is generally taking about 1/2 that dose. Her main complaint is persistent fatigue.   Diabetes  She presents for her follow-up diabetic visit. She has type 2 diabetes mellitus. No MedicAlert identification noted. Her disease course has been improving. Hypoglycemia symptoms include nervousness/anxiousness. Associated symptoms include fatigue, polydipsia and polyuria. Pertinent negatives for diabetes include no chest pain and no polyphagia. There are no hypoglycemic complications. Symptoms are improving. Diabetic complications include retinopathy. Risk factors for coronary artery disease include dyslipidemia, hypertension, stress and post-menopausal. Current diabetic treatment includes insulin injections (victoza 1.8mg  daily. ). She is compliant with treatment some of the time. She is following a generally healthy diet. Meal planning includes avoidance of concentrated sweets. She has not had a previous visit with a dietitian. She participates in exercise intermittently. Her home blood glucose trend is decreasing steadily. An ACE inhibitor/angiotensin II receptor blocker is being taken. She does not see a podiatrist.Eye exam is current.       Current Medication: Outpatient Encounter Medications as of 07/11/2018  Medication Sig  . Continuous Blood Gluc Receiver (FREESTYLE LIBRE 14 DAY READER) DEVI 1 Units by Does not apply route daily.  Marland Kitchen glucose blood (ACCU-CHEK GUIDE) test strip Blood sugar testing TID - dx E11.65  . Insulin Detemir (LEVEMIR FLEXTOUCH) 100 UNIT/ML Pen Inject 40 Units into the skin daily after supper.   . Insulin Pen Needle (FIFTY50 PEN NEEDLES) 32G X 4 MM MISC 1 Package by Does not apply route 3 (three) times daily.  . Lancets (ACCU-CHEK SAFE-T PRO) lancets Blood sugar testing done 2 times daily - E11.65  . liraglutide (VICTOZA) 18 MG/3ML SOPN Inject 0.3 mLs (1.8 mg total) into the skin daily. Please add to patient's med list.  . lisinopril (PRINIVIL,ZESTRIL) 5 MG tablet Take 1 tablet (5 mg total) by mouth daily. Please add on patient's med list  . rosuvastatin (CRESTOR) 5 MG tablet Take 1 tablet (5 mg total) by mouth at bedtime.  . [DISCONTINUED] glucose blood (ACCU-CHEK GUIDE) test strip Blood sugar testing TID - dx E11.65  . [DISCONTINUED] Insulin Detemir (LEVEMIR FLEXTOUCH) 100 UNIT/ML Pen Inject 40 Units into the skin daily after supper.  . [DISCONTINUED] Insulin Pen Needle (FIFTY50 PEN NEEDLES) 32G X 4 MM MISC 1 Package by Does not apply route 3 (three) times daily.  . [DISCONTINUED] liraglutide (VICTOZA) 18 MG/3ML SOPN Inject 0.3 mLs (1.8 mg total) into the skin daily. Please add to patient's med list.  . [DISCONTINUED] lisinopril (PRINIVIL,ZESTRIL) 5 MG tablet Take 1 tablet (5 mg total) by mouth daily. Please add on patient's med list  . [DISCONTINUED] rosuvastatin (CRESTOR) 5 MG tablet Take 1 tablet (5 mg total) by mouth at bedtime.   No facility-administered encounter medications on file as of 07/11/2018.     Surgical History: Past Surgical History:  Procedure Laterality Date  . BREAST SURGERY  2010   left breast tumor removal    Medical History: Past Medical History:  Diagnosis Date  . Cancer [facility])    left breast s/p left lumpectomy  . Diabetes mellitus without complication ([place])   .  Hypertension     Family History: No family history on file.  Social History   Socioeconomic History  . Marital status: Married    Spouse name: Not on file  . Number of children: Not on file  . Years of education: Not on file  . Highest education level: Not on file  Occupational  History  . Not on file  Social Needs  . Financial resource strain: Not on file  . Food insecurity:    Worry: Not on file    Inability: Not on file  . Transportation needs:    Medical: Not on file    Non-medical: Not on file  Tobacco Use  . Smoking status: Former Smoker    Types: Cigarettes  . Smokeless tobacco: Never Used  Substance and Sexual Activity  . Alcohol use: Yes    Comment: ocassionally  . Drug use: No  . Sexual activity: Never  Lifestyle  . Physical activity:    Days per week: Not on file    Minutes per session: Not on file  . Stress: Not on file  Relationships  . Social connections:    Talks on phone: Not on file    Gets together: Not on file    Attends religious service: Not on file    Active member of club or organization: Not on file    Attends meetings of clubs or organizations: Not on file    Relationship status: Not on file  . Intimate partner violence:    Fear of current or ex partner: Not on file    Emotionally abused: Not on file    Physically abused: Not on file    Forced sexual activity: Not on file  Other Topics Concern  . Not on file  Social History Narrative  . Not on file      Review of Systems  Constitutional: Positive for fatigue. Negative for activity change, appetite change and unexpected weight change.  HENT: Negative for postnasal drip, rhinorrhea, sinus pressure and voice change.   Eyes: Negative.   Respiratory: Negative for cough, chest tightness and wheezing.   Cardiovascular: Negative for chest pain and palpitations.  Gastrointestinal: Positive for diarrhea. Negative for abdominal pain and nausea.  Endocrine: Positive for polydipsia and polyuria. Negative for cold intolerance, heat intolerance and polyphagia.       Blood sugars improving but still very elevated.   Genitourinary: Positive for frequency.  Musculoskeletal: Negative.   Skin: Negative.   Allergic/Immunologic: Positive for environmental allergies. Negative for  food allergies and immunocompromised state.  Hematological: Negative for adenopathy.  Psychiatric/Behavioral: Negative for behavioral problems. The patient is nervous/anxious.     Today's Vitals   07/11/18 1519  BP: 136/90  Pulse: 72  Resp: 16  SpO2: 98%  Weight: 153 lb 12.8 oz (69.8 kg)  Height: 5\' 5"  (1.651 m)   Physical Exam  Constitutional: She is oriented to person, place, and time. She appears well-developed and well-nourished. No distress.  HENT:  Head: Normocephalic and atraumatic.  Nose: Nose normal.  Mouth/Throat: Oropharynx is clear and moist. No oropharyngeal exudate.  Eyes: Pupils are equal, round, and reactive to light. Conjunctivae and EOM are normal.  Neck: Normal range of motion. Neck supple. No JVD present. No tracheal deviation present. No thyromegaly present.  Cardiovascular: Normal rate, regular rhythm and normal heart sounds. Exam reveals no gallop and no friction rub.  No murmur heard. Pulmonary/Chest: Effort normal and breath sounds normal. No respiratory distress. She has no wheezes. She has  no rales. She exhibits no tenderness.  Abdominal: Soft. Bowel sounds are normal. There is no tenderness.  Musculoskeletal: Normal range of motion.  Lymphadenopathy:    She has no cervical adenopathy.  Neurological: She is alert and oriented to person, place, and time. No cranial nerve deficit.  Skin: Skin is warm and dry. Capillary refill takes less than 2 seconds. She is not diaphoretic.  Psychiatric: She has a normal mood and affect. Her behavior is normal. Judgment and thought content normal.  Nursing note and vitals reviewed.  Assessment/Plan: 1. Uncontrolled type 2 diabetes mellitus with hyperglycemia (HCC) - POCT HgB A1C 10.1 today. Though still very elevated, down from last check of 12.6. Reviewed importance of taking all medications as prescribed. Discussed risk factors associated with having long-term, uncontrolled type 2 diabetes.  - glucose blood (ACCU-CHEK  GUIDE) test strip; Blood sugar testing TID - dx E11.65  Dispense: 360 each; Refill: 4  2. Type 2 diabetes mellitus with hyperglycemia, unspecified whether long term insulin use ([place]) Advised her to gradually increase dose of levemir to 40units daily. Goal, is to have her morning, fasting blood sugars to be consistently below 150. She should continue victoza 1.8mg  every day.  - Insulin Detemir (LEVEMIR FLEXTOUCH) 100 UNIT/ML Pen; Inject 40 Units into the skin daily after supper.  Dispense: 9 pen; Refill: 5 - liraglutide (VICTOZA) 18 MG/3ML SOPN; Inject 0.3 mLs (1.8 mg total) into the skin daily. Please add to patient's med list.  Dispense: 9 pen; Refill: 5 - Insulin Pen Needle (FIFTY50 PEN NEEDLES) 32G X 4 MM MISC; 1 Package by Does not apply route 3 (three) times daily.  Dispense: 360 each; Refill: 4  3. Essential (primary) hypertension Stable. Continue lisinopril as prescribed - lisinopril (PRINIVIL,ZESTRIL) 5 MG tablet; Take 1 tablet (5 mg total) by mouth daily. Please add on patient's med list  Dispense: 90 tablet; Refill: 4  4. Hyperlipidemia, unspecified hyperlipidemia type Restart crestor, as she admits she has not been taking this as prescribed.  - rosuvastatin (CRESTOR) 5 MG tablet; Take 1 tablet (5 mg total) by mouth at bedtime.  Dispense: 90 tablet; Refill: 4  5. Needs flu shot - Flu Vaccine MDCK QUAD PF  6. Screening for colon cancer - Ambulatory referral to Gastroenterology  General Counseling: zemira zehring understanding of the findings of todays visit and agrees with plan of treatment. I have discussed any further diagnostic evaluation that may be needed or ordered today. We also reviewed her medications today. she has been encouraged to call the office with any questions or concerns that should arise related to todays visit.  Diabetes Counseling:  1. Addition of ACE inh/ ARB'S for nephroprotection. Microalbumin is updated  2. Diabetic foot care, prevention of complications.  Podiatry consult 3. Exercise and lose weight.  4. Diabetic eye examination, Diabetic eye exam is updated  5. Monitor blood sugar closlely. nutrition counseling.  6. Sign and symptoms of hypoglycemia including shaking sweating,confusion and headaches.   Counseling: Adherence of Medical Therapy: The patient understands that it is the responsibility of the patient to complete all prescribed medications, all recommended testing, including but not limited to, laboratory studies and imaging. The patient further understands the need to keep all scheduled follow-up visits and to inform the office immediately of any changes in their medical condition. The patient understands that the success of treatment in large part depends on the patient's willingness to complete the therapeutic regimen and to work in partnership with the designated health-care providers.  This  patient was seen by Leretha Pol FNP Collaboration with Dr Lavera Guise as a part of collaborative care agreement  Orders Placed This Encounter  Procedures  . Flu Vaccine MDCK QUAD PF  . Ambulatory referral to Gastroenterology  . POCT HgB A1C    Meds ordered this encounter  Medications  . Insulin Detemir (LEVEMIR FLEXTOUCH) 100 UNIT/ML Pen    Sig: Inject 40 Units into the skin daily after supper.    Dispense:  9 pen    Refill:  5    Please file on patient's med list.    Order Specific Question:   Supervising Provider    Answer:   Lavera Guise [[id number]]  . liraglutide (VICTOZA) 18 MG/3ML SOPN    Sig: Inject 0.3 mLs (1.8 mg total) into the skin daily. Please add to patient's med list.    Dispense:  9 pen    Refill:  5    Order Specific Question:   Supervising Provider    Answer:   Lavera Guise [[id number]]  . lisinopril (PRINIVIL,ZESTRIL) 5 MG tablet    Sig: Take 1 tablet (5 mg total) by mouth daily. Please add on patient's med list    Dispense:  90 tablet    Refill:  4    Order Specific Question:   Supervising Provider    Answer:    Lavera Guise [[id number]]  . rosuvastatin (CRESTOR) 5 MG tablet    Sig: Take 1 tablet (5 mg total) by mouth at bedtime.    Dispense:  90 tablet    Refill:  4    Please add to patient's med list    Order Specific Question:   Supervising Provider    Answer:   Lavera Guise [[id number]]  . Insulin Pen Needle (FIFTY50 PEN NEEDLES) 32G X 4 MM MISC    Sig: 1 Package by Does not apply route 3 (three) times daily.    Dispense:  360 each    Refill:  4    Patient needs for levemir and victoza injections.    Order Specific Question:   Supervising Provider    Answer:   Lavera Guise [[id number]]  . glucose blood (ACCU-CHEK GUIDE) test strip    Sig: Blood sugar testing TID - dx E11.65    Dispense:  360 each    Refill:  4    Patient was given Accu-Check guide meter today.    Order Specific Question:   Supervising Provider    Answer:   Lavera Guise [[id number]]    Time spent: 54 Minutes      Dr Lavera Guise Internal medicine

## 2018-07-12 DIAGNOSIS — L570 Actinic keratosis: Secondary | ICD-10-CM | POA: Diagnosis not present

## 2018-07-18 ENCOUNTER — Other Ambulatory Visit: Payer: Self-pay | Admitting: Nurse Practitioner

## 2018-07-18 ENCOUNTER — Encounter: Payer: Self-pay | Admitting: *Deleted

## 2018-07-18 DIAGNOSIS — Z1211 Encounter for screening for malignant neoplasm of colon: Secondary | ICD-10-CM | POA: Insufficient documentation

## 2018-07-18 DIAGNOSIS — E1165 Type 2 diabetes mellitus with hyperglycemia: Secondary | ICD-10-CM | POA: Insufficient documentation

## 2018-07-18 DIAGNOSIS — Z23 Encounter for immunization: Secondary | ICD-10-CM | POA: Insufficient documentation

## 2018-07-18 MED ORDER — INSULIN GLARGINE 100 UNIT/ML SOLOSTAR PEN: 40.0000 [IU] | PEN_INJECTOR | Freq: Every day | SUBCUTANEOUS | 6 refills | Status: DC

## 2018-07-20 ENCOUNTER — Ambulatory Visit: Payer: Self-pay | Admitting: Adult Health

## 2018-09-22 DIAGNOSIS — H18822 Corneal disorder due to contact lens, left eye: Secondary | ICD-10-CM | POA: Diagnosis not present

## 2018-09-29 ENCOUNTER — Telehealth: Payer: Self-pay | Admitting: Nurse Practitioner

## 2018-09-29 NOTE — Telephone Encounter (Signed)
Prior auth for Victoza has been approved ,through 09/29/2019. Pharmacy notified

## 2018-10-14 ENCOUNTER — Ambulatory Visit: Payer: Self-pay | Admitting: Nurse Practitioner

## 2018-12-22 DIAGNOSIS — Z171 Estrogen receptor negative status [ER-]: Secondary | ICD-10-CM | POA: Diagnosis not present

## 2018-12-22 DIAGNOSIS — C50912 Malignant neoplasm of unspecified site of left female breast: Secondary | ICD-10-CM | POA: Diagnosis not present

## 2018-12-22 DIAGNOSIS — Z1231 Encounter for screening mammogram for malignant neoplasm of breast: Secondary | ICD-10-CM | POA: Diagnosis not present

## 2019-01-17 ENCOUNTER — Other Ambulatory Visit: Payer: Self-pay

## 2019-01-17 ENCOUNTER — Ambulatory Visit: Payer: BC Managed Care – PPO | Admitting: Nurse Practitioner

## 2019-01-17 ENCOUNTER — Encounter: Payer: Self-pay | Admitting: Nurse Practitioner

## 2019-01-17 DIAGNOSIS — E1165 Type 2 diabetes mellitus with hyperglycemia: Secondary | ICD-10-CM

## 2019-01-17 DIAGNOSIS — R1110 Vomiting, unspecified: Secondary | ICD-10-CM | POA: Insufficient documentation

## 2019-01-17 DIAGNOSIS — R112 Nausea with vomiting, unspecified: Secondary | ICD-10-CM | POA: Diagnosis not present

## 2019-01-17 DIAGNOSIS — R51 Headache: Secondary | ICD-10-CM | POA: Diagnosis not present

## 2019-01-17 DIAGNOSIS — R519 Headache, unspecified: Secondary | ICD-10-CM | POA: Insufficient documentation

## 2019-01-17 DIAGNOSIS — I10 Essential (primary) hypertension: Secondary | ICD-10-CM

## 2019-01-17 MED ORDER — ONDANSETRON HCL 4 MG PO TABS: 4.0000 mg | ORAL_TABLET | Freq: Three times a day (TID) | ORAL | 0 refills | Status: DC | PRN

## 2019-01-17 MED ORDER — BUTALBITAL-APAP-CAFFEINE 50-325-40 MG PO TABS: 1.0000 | ORAL_TABLET | Freq: Four times a day (QID) | ORAL | 0 refills | Status: AC | PRN

## 2019-01-17 NOTE — Progress Notes (Cosign Needed)
[facility] [place],  [address]  Internal MEDICINE  Office Visit Note  Patient Name: Kristie Bowman  [id number]  [id number]  Date of Service: 01/17/2019  Chief Complaint  Patient presents with  . Diarrhea    320 gulcose was non  fasting   . Headache  . Vomiting     The patient has been contacted via telephone for sick visit due to concerns for spread of novel coronavirus. She started feeling poorly yesterday right after lunch. Has had severe headache which caused her and has vomited a few times. She denies fever or body aches. She states that she had two episodes of diarrhea on Sunday, prior to the development of this illness. She does not believe she has been exposed to anyone with COVID 19.   Pt is here for a sick visit.     Current Medication:  Outpatient Encounter Medications as of 01/17/2019  Medication Sig  . Continuous Blood Gluc Receiver (FREESTYLE LIBRE 14 DAY READER) DEVI 1 Units by Does not apply route daily.  Marland Kitchen glucose blood (ACCU-CHEK GUIDE) test strip Blood sugar testing TID - dx E11.65  . Insulin Glargine (LANTUS) 100 UNIT/ML Solostar Pen Inject 40 Units into the skin daily. Inject 40 units into skin daily after supper .  Marland Kitchen Insulin Pen Needle (FIFTY50 PEN NEEDLES) 32G X 4 MM MISC 1 Package by Does not apply route 3 (three) times daily.  . Lancets (ACCU-CHEK SAFE-T PRO) lancets Blood sugar testing done 2 times daily - E11.65  . liraglutide (VICTOZA) 18 MG/3ML SOPN Inject 0.3 mLs (1.8 mg total) into the skin daily. Please add to patient's med list.  . lisinopril (PRINIVIL,ZESTRIL) 5 MG tablet Take 1 tablet (5 mg total) by mouth daily. Please add on patient's med list  . rosuvastatin (CRESTOR) 5 MG tablet Take 1 tablet (5 mg total) by mouth at bedtime.  . butalbital-acetaminophen-caffeine (FIORICET, ESGIC) 50-325-40 MG tablet Take 1-2 tablets by mouth every 6 (six) hours as needed for headache.  . ondansetron (ZOFRAN) 4 MG tablet  Take 1 tablet (4 mg total) by mouth every 8 (eight) hours as needed for nausea or vomiting.   No facility-administered encounter medications on file as of 01/17/2019.       Medical History: Past Medical History:  Diagnosis Date  . Cancer [facility])    left breast s/p left lumpectomy  . Diabetes mellitus without complication ([place])   . Hypertension      Vital Signs: There were no vitals taken for this visit.   Review of Systems  Constitutional: Positive for appetite change and fatigue. Negative for chills, fever and unexpected weight change.  HENT: Negative for congestion, postnasal drip, rhinorrhea, sneezing and sore throat.   Respiratory: Negative for cough, chest tightness and shortness of breath.   Cardiovascular: Negative for chest pain and palpitations.  Gastrointestinal: Positive for diarrhea, nausea and vomiting. Negative for abdominal pain and constipation.  Endocrine:       States that her blood sugars have been elevated since illness started .  Musculoskeletal: Negative for arthralgias, back pain, joint swelling and neck pain.  Skin: Negative for rash.  Neurological: Positive for headaches. Negative for tremors and numbness.  Hematological: Negative for adenopathy. Does not bruise/bleed easily.  Psychiatric/Behavioral: Negative for behavioral problems (Depression), sleep disturbance and suicidal ideas. The patient is not nervous/anxious.    Assessment/Plan: 1. Intractable vomiting with nausea, unspecified vomiting type Add zofran 4mg  up to tid prn nausea and vomiting. Rest and  increase intake of fluids to prevent dehydration. BRAT diet suggested as she begins to feel better. Advance diet slowly and as tolerated.  - ondansetron (ZOFRAN) 4 MG tablet; Take 1 tablet (4 mg total) by mouth every 8 (eight) hours as needed for nausea or vomiting.  Dispense: 30 tablet; Refill: 0  2. Acute intractable headache, unspecified headache type Short term prescription given for fioricet.  Advised she may take this up to four times daily, but only as needed for severe headache. Do not take additional acetaminophen. Rest in dark room. suggested cool compress over forehead and eyes to help relieve pain.  - butalbital-acetaminophen-caffeine (FIORICET, ESGIC) 50-325-40 MG tablet; Take 1-2 tablets by mouth every 6 (six) hours as needed for headache.  Dispense: 20 tablet; Refill: 0  3. Uncontrolled type 2 diabetes mellitus with hyperglycemia ([place]) Patient to continue with diabetic medication as prescribed. Will check HgbA1c at her scheduled follow up 02/06/2019.   4. Essential (primary) hypertension Continue bp medication as prescribed   General Counseling: osa fogarty understanding of the findings of todays visit and agrees with plan of treatment. I have discussed any further diagnostic evaluation that may be needed or ordered today. We also reviewed her medications today. she has been encouraged to call the office with any questions or concerns that should arise related to todays visit.    Counseling:  Rest and increase fluids. Continue using OTC medication to control symptoms.   The 'BRAT' diet is suggested, then progress to diet as tolerated as symptoms abate. Call if bloody stools, persistent diarrhea, vomiting, fever or abdominal pain.  This patient was seen by [place] with Dr Lavera Guise as a part of collaborative care agreement  Meds ordered this encounter  Medications  . butalbital-acetaminophen-caffeine (FIORICET, ESGIC) 50-325-40 MG tablet    Sig: Take 1-2 tablets by mouth every 6 (six) hours as needed for headache.    Dispense:  20 tablet    Refill:  0    Order Specific Question:   Supervising Provider    Answer:   Lavera Guise [[id number]]  . ondansetron (ZOFRAN) 4 MG tablet    Sig: Take 1 tablet (4 mg total) by mouth every 8 (eight) hours as needed for nausea or vomiting.    Dispense:  30 tablet    Refill:  0    Order Specific Question:    Supervising Provider    Answer:   Lavera Guise [[id number]]    Time spent: 25 Minutes

## 2019-01-18 NOTE — Addendum Note (Signed)
Addended by: Leretha Pol on: 01/18/2019 05:32 PM   Modules accepted: Level of Service

## 2019-02-06 ENCOUNTER — Ambulatory Visit: Payer: BC Managed Care – PPO | Admitting: Nurse Practitioner

## 2019-02-06 ENCOUNTER — Encounter: Payer: Self-pay | Admitting: Nurse Practitioner

## 2019-02-06 ENCOUNTER — Other Ambulatory Visit: Payer: Self-pay

## 2019-02-06 VITALS — Resp 16 | Ht 65.0 in | Wt 146.0 lb

## 2019-02-06 DIAGNOSIS — E785 Hyperlipidemia, unspecified: Secondary | ICD-10-CM | POA: Diagnosis not present

## 2019-02-06 DIAGNOSIS — I10 Essential (primary) hypertension: Secondary | ICD-10-CM

## 2019-02-06 DIAGNOSIS — E1165 Type 2 diabetes mellitus with hyperglycemia: Secondary | ICD-10-CM

## 2019-02-06 NOTE — Progress Notes (Signed)
[facility] [place], [place] [address]  Internal MEDICINE  Telephone Visit  Patient Name: Kristie Bowman  [id number]  [id number]  Date of Service: 02/19/2019  I connected with the patient at 4:39 by telephone  and verified the patients identity using two identifiers.   I discussed the limitations, risks, security and privacy concerns of performing an evaluation and management service by telephone and the availability of in person appointments. I also discussed with the patient that there may be a patient responsible charge related to the service.  The patient expressed understanding and agrees to proceed.    Chief Complaint  Patient presents with  . Telephone Screen  . Telephone Assessment  . Diabetes  . Quality Metric Gaps    foot exam    The patient has been contacted via telephone. for follow up visit due to concerns for spread of novel coronavirus. Patient states that she has had elevated blood sugars recently. Does not check her  Blood sugars every day, but last fasting sugar was 180 a few days ago. She is currently taking Levemir 26units every day, however, has been prescribed up to 40 units. She also takes victoza 1.8mg  daily. She does not use sliding scale insulin. She states that she feels well overall. She is stressed and tired. Is taking care of her husband whose neurological condition continues to decline .      Current Medication: Outpatient Encounter Medications as of 02/06/2019  Medication Sig  . butalbital-acetaminophen-caffeine (FIORICET, ESGIC) 50-325-40 MG tablet Take 1-2 tablets by mouth every 6 (six) hours as needed for headache.  . Continuous Blood Gluc Receiver (FREESTYLE LIBRE 14 DAY READER) DEVI 1 Units by Does not apply route daily.  Marland Kitchen glucose blood (ACCU-CHEK GUIDE) test strip Blood sugar testing TID - dx E11.65  . Insulin Glargine (LANTUS) 100 UNIT/ML Solostar Pen Inject 40 Units into the skin daily. Inject 40 units into skin daily  after supper .  Marland Kitchen Insulin Pen Needle (FIFTY50 PEN NEEDLES) 32G X 4 MM MISC 1 Package by Does not apply route 3 (three) times daily.  . Lancets (ACCU-CHEK SAFE-T PRO) lancets Blood sugar testing done 2 times daily - E11.65  . liraglutide (VICTOZA) 18 MG/3ML SOPN Inject 0.3 mLs (1.8 mg total) into the skin daily. Please add to patient's med list.  . lisinopril (PRINIVIL,ZESTRIL) 5 MG tablet Take 1 tablet (5 mg total) by mouth daily. Please add on patient's med list  . ondansetron (ZOFRAN) 4 MG tablet Take 1 tablet (4 mg total) by mouth every 8 (eight) hours as needed for nausea or vomiting.  . rosuvastatin (CRESTOR) 5 MG tablet Take 1 tablet (5 mg total) by mouth at bedtime.   No facility-administered encounter medications on file as of 02/06/2019.     Surgical History: Past Surgical History:  Procedure Laterality Date  . BREAST SURGERY  2010   left breast tumor removal    Medical History: Past Medical History:  Diagnosis Date  . Cancer [facility])    left breast s/p left lumpectomy  . Diabetes mellitus without complication ([place])   . Hypertension     Family History: History reviewed. No pertinent family history.  Social History   Socioeconomic History  . Marital status: Married    Spouse name: Not on file  . Number of children: Not on file  . Years of education: Not on file  . Highest education level: Not on file  Occupational History  . Not on file  Social  Needs  . Financial resource strain: Not on file  . Food insecurity:    Worry: Not on file    Inability: Not on file  . Transportation needs:    Medical: Not on file    Non-medical: Not on file  Tobacco Use  . Smoking status: Former Smoker    Types: Cigarettes  . Smokeless tobacco: Never Used  Substance and Sexual Activity  . Alcohol use: Yes    Comment: ocassionally  . Drug use: No  . Sexual activity: Never  Lifestyle  . Physical activity:    Days per week: Not on file    Minutes per session: Not on file  .  Stress: Not on file  Relationships  . Social connections:    Talks on phone: Not on file    Gets together: Not on file    Attends religious service: Not on file    Active member of club or organization: Not on file    Attends meetings of clubs or organizations: Not on file    Relationship status: Not on file  . Intimate partner violence:    Fear of current or ex partner: Not on file    Emotionally abused: Not on file    Physically abused: Not on file    Forced sexual activity: Not on file  Other Topics Concern  . Not on file  Social History Narrative  . Not on file      Review of Systems  Constitutional: Positive for fatigue. Negative for activity change, appetite change and unexpected weight change.  HENT: Negative for postnasal drip, rhinorrhea, sinus pressure and voice change.   Respiratory: Negative for cough, chest tightness and wheezing.   Cardiovascular: Negative for chest pain and palpitations.  Gastrointestinal: Negative for abdominal pain, diarrhea and nausea.  Endocrine: Positive for polydipsia and polyuria. Negative for cold intolerance, heat intolerance and polyphagia.       Blood sugars are very elevated.   Genitourinary: Positive for frequency.  Skin: Negative for rash.  Allergic/Immunologic: Positive for environmental allergies. Negative for food allergies and immunocompromised state.  Neurological: Negative for dizziness and headaches.  Hematological: Negative for adenopathy.  Psychiatric/Behavioral: Negative for behavioral problems. The patient is nervous/anxious.     Today's Vitals   02/06/19 1541  Resp: 16  Weight: 146 lb (66.2 kg)  Height: 5\' 5"  (1.651 m)   Body mass index is 24.3 kg/m.  Observation/Objective:  The patient is alert and oriented. She is pleasant and answers all questions appropriately. Breathing is not labored with no coughing present. She is in no acute distress.   Assessment/Plan: 1. Type 2 diabetes mellitus with  hyperglycemia, unspecified whether long term insulin use (HCC) Recommend she increase levemir by two units every three or four days until blood sugars are consistently under 150. Strongly encouraged her to check sugars much more consistently and to use sliding scale insulin as needed. Continue victoza as prescribed.   2. Essential (primary) hypertension Stable. Continue bp medication as prescribed .  3. Hyperlipidemia, unspecified hyperlipidemia type She should be taking crestor every day.   General Counseling: mala gibbard understanding of the findings of today's phone visit and agrees with plan of treatment. I have discussed any further diagnostic evaluation that may be needed or ordered today. We also reviewed her medications today. she has been encouraged to call the office with any questions or concerns that should arise related to todays visit.  Diabetes Counseling:  1. Addition of ACE inh/ ARB'S for nephroprotection.  Microalbumin is updated  2. Diabetic foot care, prevention of complications. Podiatry consult 3. Exercise and lose weight.  4. Diabetic eye examination, Diabetic eye exam is updated  5. Monitor blood sugar closlely. nutrition counseling.  6. Sign and symptoms of hypoglycemia including shaking sweating,confusion and headaches.  This patient was seen by Leretha Pol FNP Collaboration with Dr Lavera Guise as a part of collaborative care agreement  Time spent: 22 Minutes    Dr Lavera Guise Internal medicine

## 2019-06-19 ENCOUNTER — Encounter: Payer: Self-pay | Admitting: Nurse Practitioner

## 2019-06-19 ENCOUNTER — Ambulatory Visit: Payer: BC Managed Care – PPO | Admitting: Nurse Practitioner

## 2019-06-19 VITALS — Ht 64.0 in | Wt 146.0 lb

## 2019-06-19 DIAGNOSIS — E1165 Type 2 diabetes mellitus with hyperglycemia: Secondary | ICD-10-CM

## 2019-06-19 DIAGNOSIS — Z9119 Patient's noncompliance with other medical treatment and regimen: Secondary | ICD-10-CM

## 2019-06-19 DIAGNOSIS — E785 Hyperlipidemia, unspecified: Secondary | ICD-10-CM | POA: Diagnosis not present

## 2019-06-19 DIAGNOSIS — I10 Essential (primary) hypertension: Secondary | ICD-10-CM | POA: Diagnosis not present

## 2019-06-19 DIAGNOSIS — Z91199 Patient's noncompliance with other medical treatment and regimen due to unspecified reason: Secondary | ICD-10-CM

## 2019-06-19 MED ORDER — ACCU-CHEK GUIDE VI STRP: ORAL_STRIP | 4 refills | Status: DC

## 2019-06-19 NOTE — Progress Notes (Signed)
[facility] [place], [place] [address]  Internal MEDICINE  Telephone Visit  Patient Name: Kristie Bowman  [id number]  [id number]  Date of Service: 06/28/2019  I connected with the patient at 4:56pm by webcam and verified the patients identity using two identifiers.   I discussed the limitations, risks, security and privacy concerns of performing an evaluation and management service by webcam and the availability of in person appointments. I also discussed with the patient that there may be a patient responsible charge related to the service.  The patient expressed understanding and agrees to proceed.    Chief Complaint  Patient presents with  . Telephone Assessment  . Telephone Screen  . Diabetes  . Hypertension  . Medical Management of Chronic Issues    pt start back on victoza and she having side effects nausea and headaches    The patient has been contacted via webcam for follow up visit due to concerns for spread of novel coronavirus. She states that she has been taking none of her medications. She has not been checking her blood sugars or her blood pressures. She has long history of uncontrolled diabetes which has requires insulin treatment. She continues to care for her ill husband and states that she has not felt that she has the time or energy to manage her own healthcare needs. She wants to start back on all medications. Recently got them all filled. Started back on victoza 0.6mg  per day. She has also refilled her long term insulin, but is unsure what dose to start at. She states that she feels ok, just very tired all the time.       Current Medication: Outpatient Encounter Medications as of 06/19/2019  Medication Sig  . butalbital-acetaminophen-caffeine (FIORICET, ESGIC) 50-325-40 MG tablet Take 1-2 tablets by mouth every 6 (six) hours as needed for headache.  . Continuous Blood Gluc Receiver (FREESTYLE LIBRE 14 DAY READER) DEVI 1 Units by Does not  apply route daily.  Marland Kitchen glucose blood (ACCU-CHEK GUIDE) test strip Blood sugar testing TID - dx E11.65  . Insulin Glargine (LANTUS) 100 UNIT/ML Solostar Pen Inject 40 Units into the skin daily. Inject 40 units into skin daily after supper .  Marland Kitchen Insulin Pen Needle (FIFTY50 PEN NEEDLES) 32G X 4 MM MISC 1 Package by Does not apply route 3 (three) times daily.  . Lancets (ACCU-CHEK SAFE-T PRO) lancets Blood sugar testing done 2 times daily - E11.65  . liraglutide (VICTOZA) 18 MG/3ML SOPN Inject 0.3 mLs (1.8 mg total) into the skin daily. Please add to patient's med list.  . lisinopril (PRINIVIL,ZESTRIL) 5 MG tablet Take 1 tablet (5 mg total) by mouth daily. Please add on patient's med list  . ondansetron (ZOFRAN) 4 MG tablet Take 1 tablet (4 mg total) by mouth every 8 (eight) hours as needed for nausea or vomiting.  . rosuvastatin (CRESTOR) 5 MG tablet Take 1 tablet (5 mg total) by mouth at bedtime.  . [DISCONTINUED] glucose blood (ACCU-CHEK GUIDE) test strip Blood sugar testing TID - dx E11.65   No facility-administered encounter medications on file as of 06/19/2019.     Surgical History: Past Surgical History:  Procedure Laterality Date  . BREAST SURGERY  2010   left breast tumor removal    Medical History: Past Medical History:  Diagnosis Date  . Cancer [facility])    left breast s/p left lumpectomy  . Diabetes mellitus without complication ([place])   . Hypertension     Family History:  History reviewed. No pertinent family history.  Social History   Socioeconomic History  . Marital status: Married    Spouse name: Not on file  . Number of children: Not on file  . Years of education: Not on file  . Highest education level: Not on file  Occupational History  . Not on file  Social Needs  . Financial resource strain: Not on file  . Food insecurity    Worry: Not on file    Inability: Not on file  . Transportation needs    Medical: Not on file    Non-medical: Not on file  Tobacco Use   . Smoking status: Former Smoker    Types: Cigarettes  . Smokeless tobacco: Never Used  Substance and Sexual Activity  . Alcohol use: Yes    Comment: ocassionally  . Drug use: No  . Sexual activity: Never  Lifestyle  . Physical activity    Days per week: Not on file    Minutes per session: Not on file  . Stress: Not on file  Relationships  . Social Herbalist on phone: Not on file    Gets together: Not on file    Attends religious service: Not on file    Active member of club or organization: Not on file    Attends meetings of clubs or organizations: Not on file    Relationship status: Not on file  . Intimate partner violence    Fear of current or ex partner: Not on file    Emotionally abused: Not on file    Physically abused: Not on file    Forced sexual activity: Not on file  Other Topics Concern  . Not on file  Social History Narrative  . Not on file      Review of Systems  Constitutional: Positive for fatigue. Negative for activity change, appetite change and unexpected weight change.  HENT: Negative for postnasal drip, rhinorrhea, sinus pressure and voice change.   Respiratory: Negative for cough, chest tightness and wheezing.   Cardiovascular: Negative for chest pain and palpitations.  Gastrointestinal: Positive for nausea. Negative for abdominal pain, diarrhea and vomiting.  Endocrine: Positive for polydipsia and polyuria. Negative for cold intolerance, heat intolerance and polyphagia.       Blood sugars are very elevated.   Genitourinary: Positive for frequency.  Skin: Negative for rash.  Allergic/Immunologic: Positive for environmental allergies. Negative for food allergies and immunocompromised state.  Neurological: Negative for dizziness and headaches.  Hematological: Negative for adenopathy.  Psychiatric/Behavioral: Negative for behavioral problems. The patient is nervous/anxious.     Today's Vitals   06/19/19 1557  Weight: 146 lb (66.2 kg)   Height: 5\' 4"  (1.626 m)   Body mass index is 25.06 kg/m.  Observation/Objective:   The patient is alert and oriented. She is pleasant and answers all questions appropriately. Breathing is non-labored. She is in no acute distress at this time.    Assessment/Plan: 1. Uncontrolled type 2 diabetes mellitus with hyperglycemia ([place]) Advised patient to restart lantus at 10 units every day. She should increase dose by two units every 3 days until blood sugars are consistently under 150. She should take victoza 0.6mg  daily for one week. Titrate dose once weekly until dose of 1.8mg  daily is reached. She should test blood sugars twice daily. The goal is to have sugars consistently under 150 for now. reinforced importance of taking diabetic medication as prescribed and risk of continuing to live with very uncontrolled diabetes.  -  glucose blood (ACCU-CHEK GUIDE) test strip; Blood sugar testing TID - dx E11.65  Dispense: 360 each; Refill: 4  2. Essential (primary) hypertension Take blood pressure medication as prescribed.   3. Patient's noncompliance with other medical treatment and regimen einforced importance of taking diabetic medication as prescribed and risk of continuing to live with very uncontrolled diabetes.   4. Hyperlipidemia, unspecified hyperlipidemia type Patient should be taking crestor everyday.   General Counseling: lajuanda yaffe understanding of the findings of today's phone visit and agrees with plan of treatment. I have discussed any further diagnostic evaluation that may be needed or ordered today. We also reviewed her medications today. she has been encouraged to call the office with any questions or concerns that should arise related to todays visit.  Diabetes Counseling:  1. Addition of ACE inh/ ARB'S for nephroprotection. Microalbumin is updated  2. Diabetic foot care, prevention of complications. Podiatry consult 3. Exercise and lose weight.  4. Diabetic eye  examination, Diabetic eye exam is updated  5. Monitor blood sugar closlely. nutrition counseling.  6. Sign and symptoms of hypoglycemia including shaking sweating,confusion and headaches.  Counseling: Adherence of Medical Therapy: The patient understands that it is the responsibility of the patient to complete all prescribed medications, all recommended testing, including but not limited to, laboratory studies and imaging. The patient further understands the need to keep all scheduled follow-up visits and to inform the office immediately of any changes in their medical condition. The patient understands that the success of treatment in large part depends on the patient's willingness to complete the therapeutic regimen and to work in partnership with the designated health-care providers.  This patient was seen by [place] with Dr Lavera Guise as a part of collaborative care agreement  Meds ordered this encounter  Medications  . glucose blood (ACCU-CHEK GUIDE) test strip    Sig: Blood sugar testing TID - dx E11.65    Dispense:  360 each    Refill:  4    Patient was given Accu-Check guide meter today.    Order Specific Question:   Supervising Provider    Answer:   Lavera Guise [id number]    Time spent: 63 Minutes    Dr Lavera Guise Internal medicine

## 2019-06-27 ENCOUNTER — Ambulatory Visit: Payer: Self-pay | Admitting: Nurse Practitioner

## 2019-07-03 DIAGNOSIS — H401132 Primary open-angle glaucoma, bilateral, moderate stage: Secondary | ICD-10-CM | POA: Diagnosis not present

## 2019-07-03 DIAGNOSIS — H5213 Myopia, bilateral: Secondary | ICD-10-CM | POA: Diagnosis not present

## 2019-07-03 DIAGNOSIS — H25013 Cortical age-related cataract, bilateral: Secondary | ICD-10-CM | POA: Diagnosis not present

## 2019-07-03 DIAGNOSIS — E1139 Type 2 diabetes mellitus with other diabetic ophthalmic complication: Secondary | ICD-10-CM | POA: Diagnosis not present

## 2019-07-05 ENCOUNTER — Other Ambulatory Visit: Payer: Self-pay

## 2019-07-05 DIAGNOSIS — E785 Hyperlipidemia, unspecified: Secondary | ICD-10-CM

## 2019-07-05 MED ORDER — ROSUVASTATIN CALCIUM 5 MG PO TABS: 5.0000 mg | ORAL_TABLET | Freq: Every day | ORAL | 1 refills | Status: DC

## 2019-08-01 ENCOUNTER — Ambulatory Visit (INDEPENDENT_AMBULATORY_CARE_PROVIDER_SITE_OTHER): Payer: BC Managed Care – PPO | Admitting: Nurse Practitioner

## 2019-08-01 ENCOUNTER — Other Ambulatory Visit: Payer: Self-pay

## 2019-08-01 ENCOUNTER — Encounter: Payer: Self-pay | Admitting: Nurse Practitioner

## 2019-08-01 VITALS — BP 103/81 | HR 100 | Temp 97.3°F | Resp 16 | Ht 64.0 in | Wt 157.0 lb

## 2019-08-01 DIAGNOSIS — Z9119 Patient's noncompliance with other medical treatment and regimen: Secondary | ICD-10-CM

## 2019-08-01 DIAGNOSIS — Z794 Long term (current) use of insulin: Secondary | ICD-10-CM | POA: Diagnosis not present

## 2019-08-01 DIAGNOSIS — E1165 Type 2 diabetes mellitus with hyperglycemia: Secondary | ICD-10-CM | POA: Diagnosis not present

## 2019-08-01 DIAGNOSIS — Z91199 Patient's noncompliance with other medical treatment and regimen due to unspecified reason: Secondary | ICD-10-CM

## 2019-08-01 DIAGNOSIS — I10 Essential (primary) hypertension: Secondary | ICD-10-CM | POA: Diagnosis not present

## 2019-08-01 LAB — POCT GLYCOSYLATED HEMOGLOBIN (HGB A1C): Hemoglobin A1C: 10.6 % — AB (ref 4.0–5.6)

## 2019-08-01 MED ORDER — FIFTY50 PEN NEEDLES 32G X 4 MM MISC: 3 refills | Status: DC

## 2019-08-01 MED ORDER — VICTOZA 18 MG/3ML ~~LOC~~ SOPN: 1.8000 mg | PEN_INJECTOR | Freq: Every day | SUBCUTANEOUS | 5 refills | Status: DC

## 2019-08-01 MED ORDER — TOUJEO MAX SOLOSTAR 300 UNIT/ML ~~LOC~~ SOPN: 10.0000 [IU] | PEN_INJECTOR | Freq: Every day | SUBCUTANEOUS | 3 refills | Status: DC

## 2019-08-01 NOTE — Progress Notes (Signed)
[facility] [place], [place] [address]  Internal MEDICINE  Office Visit Note  Patient Name: Kristie Bowman  [id number]  [id number]  Date of Service: 08/13/2019  Chief Complaint  Patient presents with  . Diabetes     The patient is here for follow up visit. She still has not been checking her blood sugars or her blood pressures. She has long history of uncontrolled diabetes which has requires insulin treatment. She continues to care for her ill husband and states that she has not felt that she has the time or energy to manage her own healthcare needs. She wants to start back on all medications. Recently got them all filled. Started back on victoza 0.6mg  per day. She has also refilled her long term insulin, but is unsure what dose to start at. She states that she still has not started back on the long term insulin. She states that she feels ok, just very tired all the time.        Current Medication: Outpatient Encounter Medications as of 08/01/2019  Medication Sig  . butalbital-acetaminophen-caffeine (FIORICET, ESGIC) 50-325-40 MG tablet Take 1-2 tablets by mouth every 6 (six) hours as needed for headache.  . Continuous Blood Gluc Receiver (FREESTYLE LIBRE 14 DAY READER) DEVI 1 Units by Does not apply route daily.  Marland Kitchen glucose blood (ACCU-CHEK GUIDE) test strip Blood sugar testing TID - dx E11.65  . Insulin Pen Needle (FIFTY50 PEN NEEDLES) 32G X 4 MM MISC Use with Toujeo and victoza injections. Will need 2 needles per day. E11.65  . Lancets (ACCU-CHEK SAFE-T PRO) lancets Blood sugar testing done 2 times daily - E11.65  . liraglutide (VICTOZA) 18 MG/3ML SOPN Inject 0.3 mLs (1.8 mg total) into the skin daily. Please add to patient's med list.  . lisinopril (PRINIVIL,ZESTRIL) 5 MG tablet Take 1 tablet (5 mg total) by mouth daily. Please add on patient's med list  . ondansetron (ZOFRAN) 4 MG tablet Take 1 tablet (4 mg total) by mouth every 8 (eight) hours as needed  for nausea or vomiting.  . rosuvastatin (CRESTOR) 5 MG tablet Take 1 tablet (5 mg total) by mouth at bedtime.  . [DISCONTINUED] Insulin Glargine (LANTUS) 100 UNIT/ML Solostar Pen Inject 40 Units into the skin daily. Inject 40 units into skin daily after supper .  . [DISCONTINUED] Insulin Pen Needle (FIFTY50 PEN NEEDLES) 32G X 4 MM MISC 1 Package by Does not apply route 3 (three) times daily.  . [DISCONTINUED] liraglutide (VICTOZA) 18 MG/3ML SOPN Inject 0.3 mLs (1.8 mg total) into the skin daily. Please add to patient's med list.  . Insulin Glargine, 2 Unit Dial, (TOUJEO MAX SOLOSTAR) 300 UNIT/ML SOPN Inject 10 Units into the skin daily.   No facility-administered encounter medications on file as of 08/01/2019.     Surgical History: Past Surgical History:  Procedure Laterality Date  . BREAST SURGERY  2010   left breast tumor removal    Medical History: Past Medical History:  Diagnosis Date  . Cancer [facility])    left breast s/p left lumpectomy  . Diabetes mellitus without complication ([place])   . Hypertension     Family History: History reviewed. No pertinent family history.  Social History   Socioeconomic History  . Marital status: Married    Spouse name: Not on file  . Number of children: Not on file  . Years of education: Not on file  . Highest education level: Not on file  Occupational History  . Not  on file  Social Needs  . Financial resource strain: Not on file  . Food insecurity    Worry: Not on file    Inability: Not on file  . Transportation needs    Medical: Not on file    Non-medical: Not on file  Tobacco Use  . Smoking status: Former Smoker    Types: Cigarettes  . Smokeless tobacco: Never Used  Substance and Sexual Activity  . Alcohol use: Yes    Comment: ocassionally  . Drug use: No  . Sexual activity: Never  Lifestyle  . Physical activity    Days per week: Not on file    Minutes per session: Not on file  . Stress: Not on file  Relationships  .  Social Herbalist on phone: Not on file    Gets together: Not on file    Attends religious service: Not on file    Active member of club or organization: Not on file    Attends meetings of clubs or organizations: Not on file    Relationship status: Not on file  . Intimate partner violence    Fear of current or ex partner: Not on file    Emotionally abused: Not on file    Physically abused: Not on file    Forced sexual activity: Not on file  Other Topics Concern  . Not on file  Social History Narrative  . Not on file      Review of Systems  Constitutional: Positive for fatigue. Negative for activity change, appetite change and unexpected weight change.  HENT: Negative for postnasal drip, rhinorrhea, sinus pressure and voice change.   Respiratory: Negative for cough, chest tightness and wheezing.   Cardiovascular: Negative for chest pain and palpitations.  Gastrointestinal: Positive for nausea. Negative for abdominal pain, diarrhea and vomiting.  Endocrine: Positive for polydipsia and polyuria. Negative for cold intolerance, heat intolerance and polyphagia.       Blood sugars are very elevated.   Genitourinary: Positive for frequency.  Skin: Negative for rash.  Allergic/Immunologic: Positive for environmental allergies. Negative for food allergies and immunocompromised state.  Neurological: Negative for dizziness and headaches.  Hematological: Negative for adenopathy.  Psychiatric/Behavioral: Negative for behavioral problems. The patient is nervous/anxious.     Today's Vitals   08/01/19 1615  BP: 103/81  Pulse: 100  Resp: 16  Temp: (!) 97.3 F (36.3 C)  SpO2: 98%  Weight: 157 lb (71.2 kg)  Height: 5\' 4"  (1.626 m)   Body mass index is 26.95 kg/m.  Physical Exam Vitals signs and nursing note reviewed.  Constitutional:      General: She is not in acute distress.    Appearance: Normal appearance. She is well-developed. She is not diaphoretic.  HENT:      Head: Normocephalic and atraumatic.     Nose: Nose normal.     Mouth/Throat:     Pharynx: No oropharyngeal exudate.  Eyes:     Conjunctiva/sclera: Conjunctivae normal.     Pupils: Pupils are equal, round, and reactive to light.  Neck:     Musculoskeletal: Normal range of motion and neck supple.     Thyroid: No thyromegaly.     Vascular: No JVD.     Trachea: No tracheal deviation.  Cardiovascular:     Rate and Rhythm: Normal rate and regular rhythm.     Heart sounds: Normal heart sounds. No murmur. No friction rub. No gallop.   Pulmonary:     Effort: Pulmonary  effort is normal. No respiratory distress.     Breath sounds: Normal breath sounds. No wheezing or rales.  Chest:     Chest wall: No tenderness.  Abdominal:     General: Bowel sounds are normal.     Palpations: Abdomen is soft.     Tenderness: There is no abdominal tenderness.  Musculoskeletal: Normal range of motion.  Lymphadenopathy:     Cervical: No cervical adenopathy.  Skin:    General: Skin is warm and dry.     Capillary Refill: Capillary refill takes less than 2 seconds.  Neurological:     Mental Status: She is alert and oriented to person, place, and time.     Cranial Nerves: No cranial nerve deficit.  Psychiatric:        Behavior: Behavior normal.        Thought Content: Thought content normal.        Judgment: Judgment normal.    Assessment/Plan:  1. Type 2 diabetes mellitus with hyperglycemia, with long-term current use of insulin (HCC) - POCT HgB A1C 10.5 today. Increase victoza to 1.2mg  daily. Will have her start toujeo 10 units every day. Advised her to titrate insulin by two units every few days. The goal is to get fasting blood sugars to 150 or lower.  - liraglutide (VICTOZA) 18 MG/3ML SOPN; Inject 0.3 mLs (1.8 mg total) into the skin daily. Please add to patient's med list.  Dispense: 9 pen; Refill: 5 - Insulin Glargine, 2 Unit Dial, (TOUJEO MAX SOLOSTAR) 300 UNIT/ML SOPN; Inject 10 Units into the  skin daily.  Dispense: 5 pen; Refill: 3 - Insulin Pen Needle (FIFTY50 PEN NEEDLES) 32G X 4 MM MISC; Use with Toujeo and victoza injections. Will need 2 needles per day. E11.65  Dispense: 60 each; Refill: 3  2. Essential (primary) hypertension Stable. Continue bp medication as prescribed   3. Patient's noncompliance with other medical treatment and regimen Explained importance of taking all medication as prescribed. Reviewed risk factors and complications of uncontrolled diabetes. She voices understanding and states she will do better.   General Counseling: otila manganiello understanding of the findings of todays visit and agrees with plan of treatment. I have discussed any further diagnostic evaluation that may be needed or ordered today. We also reviewed her medications today. she has been encouraged to call the office with any questions or concerns that should arise related to todays visit.  Diabetes Counseling:  1. Addition of ACE inh/ ARB'S for nephroprotection. Microalbumin is updated  2. Diabetic foot care, prevention of complications. Podiatry consult 3. Exercise and lose weight.  4. Diabetic eye examination, Diabetic eye exam is updated  5. Monitor blood sugar closlely. nutrition counseling.  6. Sign and symptoms of hypoglycemia including shaking sweating,confusion and headaches.  This patient was seen by Leretha Pol FNP Collaboration with Dr Lavera Guise as a part of collaborative care agreement  Orders Placed This Encounter  Procedures  . POCT HgB A1C    Meds ordered this encounter  Medications  . liraglutide (VICTOZA) 18 MG/3ML SOPN    Sig: Inject 0.3 mLs (1.8 mg total) into the skin daily. Please add to patient's med list.    Dispense:  9 pen    Refill:  5    Order Specific Question:   Supervising Provider    Answer:   Lavera Guise [id number]  . Insulin Glargine, 2 Unit Dial, (TOUJEO MAX SOLOSTAR) 300 UNIT/ML SOPN    Sig: Inject 10 Units into the skin  daily.     Dispense:  5 pen    Refill:  3    Order Specific Question:   Supervising Provider    Answer:   Lavera Guise [id number]  . Insulin Pen Needle (FIFTY50 PEN NEEDLES) 32G X 4 MM MISC    Sig: Use with Toujeo and victoza injections. Will need 2 needles per day. E11.65    Dispense:  60 each    Refill:  3    Patient needs for toujeo and victoza injections.    Order Specific Question:   Supervising Provider    Answer:   Lavera Guise [id number]    Time spent: 14 Minutes      Dr Lavera Guise Internal medicine

## 2019-08-26 ENCOUNTER — Other Ambulatory Visit: Payer: Self-pay

## 2019-08-26 ENCOUNTER — Encounter (HOSPITAL_COMMUNITY): Payer: Self-pay

## 2019-08-26 ENCOUNTER — Emergency Department (HOSPITAL_COMMUNITY)
Admission: EM | Admit: 2019-08-26 | Discharge: 2019-08-26 | Disposition: A | Discharge status: Home / Self Care | Payer: BC Managed Care – PPO | Attending: Emergency Medicine | Admitting: Emergency Medicine

## 2019-08-26 ENCOUNTER — Emergency Department (HOSPITAL_COMMUNITY): Payer: BC Managed Care – PPO

## 2019-08-26 DIAGNOSIS — Z87891 Personal history of nicotine dependence: Secondary | ICD-10-CM | POA: Insufficient documentation

## 2019-08-26 DIAGNOSIS — Z79899 Other long term (current) drug therapy: Secondary | ICD-10-CM | POA: Insufficient documentation

## 2019-08-26 DIAGNOSIS — E113213 Type 2 diabetes mellitus with mild nonproliferative diabetic retinopathy with macular edema, bilateral: Secondary | ICD-10-CM | POA: Diagnosis not present

## 2019-08-26 DIAGNOSIS — S4991XA Unspecified injury of right shoulder and upper arm, initial encounter: Secondary | ICD-10-CM | POA: Diagnosis not present

## 2019-08-26 DIAGNOSIS — Z794 Long term (current) use of insulin: Secondary | ICD-10-CM | POA: Diagnosis not present

## 2019-08-26 DIAGNOSIS — Y9289 Other specified places as the place of occurrence of the external cause: Secondary | ICD-10-CM | POA: Diagnosis not present

## 2019-08-26 DIAGNOSIS — S42341A Displaced spiral fracture of shaft of humerus, right arm, initial encounter for closed fracture: Secondary | ICD-10-CM | POA: Diagnosis not present

## 2019-08-26 DIAGNOSIS — Y999 Unspecified external cause status: Secondary | ICD-10-CM | POA: Insufficient documentation

## 2019-08-26 DIAGNOSIS — I10 Essential (primary) hypertension: Secondary | ICD-10-CM | POA: Diagnosis not present

## 2019-08-26 DIAGNOSIS — W01198A Fall on same level from slipping, tripping and stumbling with subsequent striking against other object, initial encounter: Secondary | ICD-10-CM | POA: Diagnosis not present

## 2019-08-26 DIAGNOSIS — Y93K1 Activity, walking an animal: Secondary | ICD-10-CM | POA: Diagnosis not present

## 2019-08-26 MED ORDER — ONDANSETRON 4 MG PO TBDP: ORAL_TABLET | ORAL | 0 refills | Status: DC

## 2019-08-26 MED ORDER — ONDANSETRON 4 MG PO TBDP
4.0000 mg | ORAL_TABLET | Freq: Once | ORAL | Status: AC
  Administered 2019-08-26: 4 mg via ORAL
  Filled 2019-08-26: qty 1

## 2019-08-26 MED ORDER — OXYCODONE-ACETAMINOPHEN 5-325 MG PO TABS
1.0000 | ORAL_TABLET | Freq: Once | ORAL | Status: AC
  Administered 2019-08-26: 1 via ORAL
  Filled 2019-08-26: qty 1

## 2019-08-26 MED ORDER — ONDANSETRON HCL 4 MG/2ML IJ SOLN
4.0000 mg | Freq: Once | INTRAMUSCULAR | Status: AC
  Administered 2019-08-26: 4 mg via INTRAVENOUS
  Filled 2019-08-26: qty 2

## 2019-08-26 MED ORDER — OXYCODONE-ACETAMINOPHEN 5-325 MG PO TABS: 1.0000 | ORAL_TABLET | Freq: Four times a day (QID) | ORAL | 0 refills | Status: DC | PRN

## 2019-08-26 MED ORDER — MORPHINE SULFATE (PF) 4 MG/ML IV SOLN
4.0000 mg | Freq: Once | INTRAVENOUS | Status: AC
  Administered 2019-08-26: 4 mg via INTRAVENOUS
  Filled 2019-08-26: qty 1

## 2019-08-26 NOTE — ED Triage Notes (Signed)
Pt reports that she was walking her dog and fell in to a fence. She landed on her R shoulder. Appears deformed. Denies LOC. A&Ox4. Ambulatory.

## 2019-08-26 NOTE — Discharge Instructions (Signed)
Take motrin for pain   Take percocet for severe pain. If you get nauseated, take zofran for nausea   See ortho in a week for repeat xrays   Wear shoulder immobilizer for comfort. Your arm will likely become swollen and black and blue   Return to ER if your fingers turn blue, numbness in your right hand, uncontrolled pain

## 2019-08-26 NOTE — ED Provider Notes (Signed)
[facility] DEPT Provider Note   CSN: [id number] Arrival date & time: 08/26/19  1943     History   Chief Complaint Chief Complaint  Patient presents with  . Shoulder Pain    HPI Kristie Bowman is a 58 y.o. female hx of DM, HTN, here presenting with right shoulder injury. Patient states that she was walking the dog and fell onto the fence. She denies any head injury or other injuries She was noted to have a deformed right shoulder .  Denies any elbow or forearm pain. Denies any loss of consciousness      The history is provided by the patient.    Past Medical History:  Diagnosis Date  . Cancer [facility])    left breast s/p left lumpectomy  . Diabetes mellitus without complication ([place])   . Hypertension     Patient Active Problem List   Diagnosis Date Noted  . Intractable vomiting 01/17/2019  . Acute intractable headache 01/17/2019  . Uncontrolled type 2 diabetes mellitus with hyperglycemia ([place]) 07/18/2018  . Needs flu shot 07/18/2018  . Screening for colon cancer 07/18/2018  . Type 2 diabetes mellitus with mild nonproliferative diabetic retinopathy with macular edema, bilateral ([place]) 10/15/2017  . Essential (primary) hypertension 10/15/2017  . Patient's noncompliance with other medical treatment and regimen 10/15/2017  . Diarrhea, unspecified 10/15/2017  . Other fatigue 10/15/2017  . Type 2 diabetes mellitus with hyperglycemia ([place]) 10/15/2017  . Personal history of malignant neoplasm of breast 10/15/2017  . Low back pain 10/15/2017  . Hyperlipidemia 10/15/2017    Past Surgical History:  Procedure Laterality Date  . BREAST SURGERY  2010   left breast tumor removal     OB History   No obstetric history on file.      Home Medications    Prior to Admission medications   Medication Sig Start Date End Date Taking? Authorizing Provider  butalbital-acetaminophen-caffeine (FIORICET, ESGIC) [phone] MG tablet Take 1-2 tablets by mouth  every 6 (six) hours as needed for headache. 01/17/19 01/17/20  Ronnell Freshwater, NP  Continuous Blood Gluc Receiver (FREESTYLE LIBRE 14 DAY READER) DEVI 1 Units by Does not apply route daily. 10/15/17   Ronnell Freshwater, NP  glucose blood (ACCU-CHEK GUIDE) test strip Blood sugar testing TID - dx E11.65 06/19/19   Ronnell Freshwater, NP  Insulin Glargine, 2 Unit Dial, (TOUJEO MAX SOLOSTAR) 300 UNIT/ML SOPN Inject 10 Units into the skin daily. 08/01/19   Ronnell Freshwater, NP  Insulin Pen Needle (FIFTY50 PEN NEEDLES) 32G X 4 MM MISC Use with Toujeo and victoza injections. Will need 2 needles per day. E11.65 08/01/19   Ronnell Freshwater, NP  Lancets (ACCU-CHEK SAFE-T PRO) lancets Blood sugar testing done 2 times daily - E11.65 11/19/17   Ronnell Freshwater, NP  liraglutide (VICTOZA) 18 MG/3ML SOPN Inject 0.3 mLs (1.8 mg total) into the skin daily. Please add to patient's med list. 08/01/19   Ronnell Freshwater, NP  lisinopril (PRINIVIL,ZESTRIL) 5 MG tablet Take 1 tablet (5 mg total) by mouth daily. Please add on patient's med list 07/11/18   Ronnell Freshwater, NP  ondansetron (ZOFRAN ODT) 4 MG disintegrating tablet 4mg  ODT q4 hours prn nausea/vomit 08/26/19   Drenda Freeze, MD  ondansetron (ZOFRAN) 4 MG tablet Take 1 tablet (4 mg total) by mouth every 8 (eight) hours as needed for nausea or vomiting. 01/17/19   Ronnell Freshwater, NP  oxyCODONE-acetaminophen (PERCOCET) 5-325 MG tablet Take 1  tablet by mouth every 6 (six) hours as needed. 08/26/19   Drenda Freeze, MD  rosuvastatin (CRESTOR) 5 MG tablet Take 1 tablet (5 mg total) by mouth at bedtime. 07/05/19   Ronnell Freshwater, NP    Family History History reviewed. No pertinent family history.  Social History Social History   Tobacco Use  . Smoking status: Former Smoker    Types: Cigarettes  . Smokeless tobacco: Never Used  Substance Use Topics  . Alcohol use: Yes    Comment: ocassionally  . Drug use: No     Allergies   Codeine    Review of Systems Review of Systems  Musculoskeletal:       R shoulder pain   All other systems reviewed and are negative.    Physical Exam Updated Vital Signs BP (!) 137/92 (BP Location: Left Arm)   Pulse (!) 102   Temp 98.4 F (36.9 C) (Oral)   Resp 18   Ht 5\' 6"  (1.676 m)   Wt 67.6 kg   SpO2 99%   BMI 24.05 kg/m   Physical Exam Vitals signs and nursing note reviewed.  Constitutional:      Appearance: Normal appearance.  HENT:     Head: Normocephalic and atraumatic.     Nose: Nose normal.     Mouth/Throat:     Mouth: Mucous membranes are moist.  Eyes:     Extraocular Movements: Extraocular movements intact.     Pupils: Pupils are equal, round, and reactive to light.  Neck:     Musculoskeletal: Normal range of motion.  Cardiovascular:     Rate and Rhythm: Normal rate and regular rhythm.     Pulses: Normal pulses.     Heart sounds: Normal heart sounds.  Pulmonary:     Effort: Pulmonary effort is normal.     Breath sounds: Normal breath sounds.  Abdominal:     General: Abdomen is flat.  Musculoskeletal:     Comments: + swelling and tenderness proximal humerus, no obvious R shoulder dislocation, able to flex and extend R elbow with no obvious deformity. No bony tenderness R forearm or wrist. 2+ radial pulses. Neuro vascular intact R upper extremity. No other extremity trauma   Skin:    General: Skin is warm.     Capillary Refill: Capillary refill takes less than 2 seconds.  Neurological:     General: No focal deficit present.     Mental Status: She is alert.  Psychiatric:        Mood and Affect: Mood normal.      ED Treatments / Results  Labs (all labs ordered are listed, but only abnormal results are displayed) Labs Reviewed - No data to display  EKG None  Radiology Dg Shoulder Right  Result Date: 08/26/2019 CLINICAL DATA:  Fall, pain EXAM: RIGHT SHOULDER - 2+ VIEW; RIGHT HUMERUS - 2+ VIEW COMPARISON:  None. FINDINGS: No fracture or dislocation of  the right shoulder proper. The glenohumeral and acromioclavicular joints are preserved and in anatomic apposition. The partially imaged right chest is unremarkable. There is a mildly displaced and angulated spiral type fracture of the proximal right humeral diaphysis. No fracture or dislocation of the distal right radius. IMPRESSION: 1. There is a mildly displaced and angulated spiral type fracture of the proximal right humeral diaphysis. 2. No fracture or dislocation of the right shoulder proper. The glenohumeral and acromioclavicular joints are preserved and in anatomic apposition. Electronically Signed   By: Dorna Bloom.D.  On: 08/26/2019 20:32   Dg Humerus Right  Result Date: 08/26/2019 CLINICAL DATA:  Fall, pain EXAM: RIGHT SHOULDER - 2+ VIEW; RIGHT HUMERUS - 2+ VIEW COMPARISON:  None. FINDINGS: No fracture or dislocation of the right shoulder proper. The glenohumeral and acromioclavicular joints are preserved and in anatomic apposition. The partially imaged right chest is unremarkable. There is a mildly displaced and angulated spiral type fracture of the proximal right humeral diaphysis. No fracture or dislocation of the distal right radius. IMPRESSION: 1. There is a mildly displaced and angulated spiral type fracture of the proximal right humeral diaphysis. 2. No fracture or dislocation of the right shoulder proper. The glenohumeral and acromioclavicular joints are preserved and in anatomic apposition. Electronically Signed   By: Eddie Candle M.D.   On: 08/26/2019 20:32    Procedures Procedures (including critical care time)  Medications Ordered in ED Medications  morphine 4 MG/ML injection 4 mg (4 mg Intravenous Given 08/26/19 2038)  ondansetron (ZOFRAN) injection 4 mg (4 mg Intravenous Given 08/26/19 2037)     Initial Impression / Assessment and Plan / ED Course  I have reviewed the triage vital signs and the nursing notes.  Pertinent labs & imaging results that were available during  my care of the patient were reviewed by me and considered in my medical decision making (see chart for details).       Kristie Bowman is a 58 y.o. female here with fall, R shoulder pain. Likely R proximal humerus fracture. No head injury or other injuries. Will get xrays and give pain meds.   9:09 PM Xray showed displaced spiral fracture R proximal humerus with no shoulder dislocation.  Shoulder immobilizer is placed on the patient by Ortho tech.  Will discharge home with Percocet for pain and Zofran for nausea.  We will have her follow-up with orthopedic doctor in a week for repeat x-rays.   Final Clinical Impressions(s) / ED Diagnoses   Final diagnoses:  Closed displaced spiral fracture of shaft of right humerus, initial encounter    ED Discharge Orders         Ordered    oxyCODONE-acetaminophen (PERCOCET) 5-325 MG tablet  Every 6 hours PRN     08/26/19 2108    ondansetron (ZOFRAN ODT) 4 MG disintegrating tablet     08/26/19 2108           Drenda Freeze, MD 08/26/19 2110

## 2019-08-28 ENCOUNTER — Other Ambulatory Visit: Payer: Self-pay | Admitting: Nurse Practitioner

## 2019-08-28 MED ORDER — ACCU-CHEK FASTCLIX LANCETS MISC: 3 refills | Status: AC

## 2019-08-29 ENCOUNTER — Other Ambulatory Visit: Payer: Self-pay

## 2019-08-29 DIAGNOSIS — E1165 Type 2 diabetes mellitus with hyperglycemia: Secondary | ICD-10-CM

## 2019-08-29 MED ORDER — ACCU-CHEK GUIDE VI STRP: ORAL_STRIP | 4 refills | Status: DC

## 2019-08-30 ENCOUNTER — Other Ambulatory Visit: Payer: Self-pay | Admitting: Nurse Practitioner

## 2019-08-30 DIAGNOSIS — E1165 Type 2 diabetes mellitus with hyperglycemia: Secondary | ICD-10-CM

## 2019-08-30 MED ORDER — ACCU-CHEK GUIDE VI STRP: ORAL_STRIP | 11 refills | Status: AC

## 2019-08-31 ENCOUNTER — Telehealth: Payer: Self-pay | Admitting: Orthopaedic Surgery

## 2019-08-31 ENCOUNTER — Ambulatory Visit (INDEPENDENT_AMBULATORY_CARE_PROVIDER_SITE_OTHER): Payer: BC Managed Care – PPO | Admitting: Orthopaedic Surgery

## 2019-08-31 ENCOUNTER — Other Ambulatory Visit: Payer: Self-pay

## 2019-08-31 ENCOUNTER — Encounter: Payer: Self-pay | Admitting: Orthopaedic Surgery

## 2019-08-31 VITALS — Ht 66.5 in | Wt 149.0 lb

## 2019-08-31 DIAGNOSIS — S42331A Displaced oblique fracture of shaft of humerus, right arm, initial encounter for closed fracture: Secondary | ICD-10-CM | POA: Diagnosis not present

## 2019-08-31 NOTE — Telephone Encounter (Signed)
Pt called in said she came in today to see dr.xu and forgot to ask if he could give her pain medication for her right shoulder. Please have that sent to [facility] in [place] on [address]   [phone]

## 2019-08-31 NOTE — Progress Notes (Signed)
Office Visit Note   Patient: Kristie Bowman           Date of Birth: [birth year]           MRN: [id number] Visit Date: 08/31/2019              Requested by: Ronnell Freshwater, NP [address] [place],  [place] [address] PCP: Ronnell Freshwater, NP   Assessment & Plan: Visit Diagnoses:  1. Closed displaced oblique fracture of shaft of right humerus, initial encounter     Plan: Impression is right proximal humeral shaft fracture.  I reviewed the x-rays with the patient which show a long oblique fracture of the proximal humeral shaft.  She has valgus alignment of about 15 degrees.  Based on our discussion on surgical versus nonsurgical treatment we will attempt nonoperative treatment with a Sarmiento brace.  I would like to see her back in the next few days to get repeat x-rays of the right humerus in the Sarmiento brace.   Follow-Up Instructions: Return in about 1 day (around 09/01/2019) for recheck xrays in sarmiento.   Orders:  No orders of the defined types were placed in this encounter.  No orders of the defined types were placed in this encounter.     Procedures: No procedures performed   Clinical Data: No additional findings.   Subjective: Chief Complaint  Patient presents with  . Arm Injury    DOI 08/26/2019    Anglea is a 58 year old female comes in for evaluation of acute right humeral shaft fracture sustained 5 days ago from a mechanical fall while walking her dog.  She is right-hand dominant and works for [employer].  She is a poorly controlled diabetic with most recent A1c of greater than 10.  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   Review of Systems  Constitutional: Negative.   HENT: Negative.   Eyes: Negative.   Respiratory: Negative.   Cardiovascular: Negative.   Endocrine: Negative.   Musculoskeletal: Negative.   Neurological: Negative.   Hematological: Negative.   Psychiatric/Behavioral:  Negative.   All other systems reviewed and are negative.    Objective: Vital Signs: Ht 5' 6.5" (1.689 m)   Wt 149 lb (67.6 kg)   BMI 23.69 kg/m   Physical Exam Vitals signs and nursing note reviewed.  Constitutional:      Appearance: She is well-developed.  HENT:     Head: Normocephalic and atraumatic.  Neck:     Musculoskeletal: Neck supple.  Pulmonary:     Effort: Pulmonary effort is normal.  Abdominal:     Palpations: Abdomen is soft.  Skin:    General: Skin is warm.     Capillary Refill: Capillary refill takes less than 2 seconds.  Neurological:     Mental Status: She is alert and oriented to person, place, and time.  Psychiatric:        Behavior: Behavior normal.        Thought Content: Thought content normal.        Judgment: Judgment normal.     Ortho Exam Right upper extremity exam shows moderate swelling.  No neurovascular compromise. Specialty Comments:  No specialty comments available.  Imaging: No results found.   PMFS History: Patient Active Problem List   Diagnosis Date Noted  . Closed displaced oblique fracture of shaft of right humerus 08/31/2019  . Intractable vomiting 01/17/2019  . Acute intractable headache 01/17/2019  . Uncontrolled  type 2 diabetes mellitus with hyperglycemia ([place]) 07/18/2018  . Needs flu shot 07/18/2018  . Screening for colon cancer 07/18/2018  . Type 2 diabetes mellitus with mild nonproliferative diabetic retinopathy with macular edema, bilateral ([place]) 10/15/2017  . Essential (primary) hypertension 10/15/2017  . Patient's noncompliance with other medical treatment and regimen 10/15/2017  . Diarrhea, unspecified 10/15/2017  . Other fatigue 10/15/2017  . Type 2 diabetes mellitus with hyperglycemia ([place]) 10/15/2017  . Personal history of malignant neoplasm of breast 10/15/2017  . Low back pain 10/15/2017  . Hyperlipidemia 10/15/2017   Past Medical History:  Diagnosis Date  . Cancer [facility])    left breast s/p left  lumpectomy  . Diabetes mellitus without complication ([place])   . Hypertension     History reviewed. No pertinent family history.  Past Surgical History:  Procedure Laterality Date  . BREAST SURGERY  2010   left breast tumor removal   Social History   Occupational History  . Not on file  Tobacco Use  . Smoking status: Former Smoker    Types: Cigarettes  . Smokeless tobacco: Never Used  Substance and Sexual Activity  . Alcohol use: Yes    Comment: ocassionally  . Drug use: No  . Sexual activity: Never

## 2019-08-31 NOTE — Telephone Encounter (Signed)
Please send this in. Thank you.

## 2019-09-01 ENCOUNTER — Other Ambulatory Visit: Payer: Self-pay | Admitting: Physician Assistant

## 2019-09-01 MED ORDER — HYDROCODONE-ACETAMINOPHEN 5-325 MG PO TABS: 1.0000 | ORAL_TABLET | Freq: Three times a day (TID) | ORAL | 0 refills | Status: DC | PRN

## 2019-09-01 NOTE — Telephone Encounter (Signed)
I tried to call patient to advise. Voicemail not set up yet.

## 2019-09-01 NOTE — Telephone Encounter (Signed)
Sent in

## 2019-09-05 ENCOUNTER — Encounter: Payer: Self-pay | Admitting: Orthopaedic Surgery

## 2019-09-05 ENCOUNTER — Other Ambulatory Visit: Payer: Self-pay

## 2019-09-05 ENCOUNTER — Ambulatory Visit: Payer: Self-pay

## 2019-09-05 ENCOUNTER — Ambulatory Visit (INDEPENDENT_AMBULATORY_CARE_PROVIDER_SITE_OTHER): Payer: BC Managed Care – PPO | Admitting: Orthopaedic Surgery

## 2019-09-05 DIAGNOSIS — S42331A Displaced oblique fracture of shaft of humerus, right arm, initial encounter for closed fracture: Secondary | ICD-10-CM | POA: Diagnosis not present

## 2019-09-05 MED ORDER — IBUPROFEN 800 MG PO TABS: 800.0000 mg | ORAL_TABLET | Freq: Three times a day (TID) | ORAL | 2 refills | Status: AC | PRN

## 2019-09-05 NOTE — Progress Notes (Signed)
Office Visit Note   Patient: CARDELIA ROCHLIN           Date of Birth: 31-May-1961           MRN: [id number] Visit Date: 09/05/2019              Requested by: Ronnell Freshwater, NP [address] [place],  [place] [address] PCP: Ronnell Freshwater, NP   Assessment & Plan: Visit Diagnoses:  1. Closed displaced oblique fracture of shaft of right humerus, initial encounter     Plan: X-rays were reviewed today with the patient which shows overall acceptable alignment of the fracture.  We will continue nonoperative treatment with close monitoring of the fracture.  Follow-up next week with two-view x-rays of the right humerus in the Sarmiento brace.  Patient instructed to remove the sling a couple times a day to move elbow joint.  Follow-Up Instructions: Return in about 1 week (around 09/12/2019).   Orders:  Orders Placed This Encounter  Procedures  . XR Humerus Right   Meds ordered this encounter  Medications  . ibuprofen (ADVIL) 800 MG tablet    Sig: Take 1 tablet (800 mg total) by mouth every 8 (eight) hours as needed.    Dispense:  30 tablet    Refill:  2      Procedures: No procedures performed   Clinical Data: No additional findings.   Subjective: Chief Complaint  Patient presents with  . Right Shoulder - Follow-up    Sherilee returns today for recheck of her right humerus fracture.  She is currently wearing a Sarmiento and shoulder sling.  Overall doing well.   Review of Systems   Objective: Vital Signs: There were no vitals taken for this visit.  Physical Exam  Ortho Exam Right upper extremity shows moderate swelling of the hand and wrist.  Well fitting Sarmiento brace and shoulder sling. Specialty Comments:  No specialty comments available.  Imaging: Xr Humerus Right  Result Date: 09/05/2019 Acceptable alignment of humeral shaft fracture in sarmiento brace.    PMFS History: Patient Active Problem List   Diagnosis Date Noted  . Closed displaced  oblique fracture of shaft of right humerus 08/31/2019  . Intractable vomiting 01/17/2019  . Acute intractable headache 01/17/2019  . Uncontrolled type 2 diabetes mellitus with hyperglycemia ([place]) 07/18/2018  . Needs flu shot 07/18/2018  . Screening for colon cancer 07/18/2018  . Type 2 diabetes mellitus with mild nonproliferative diabetic retinopathy with macular edema, bilateral ([place]) 10/15/2017  . Essential (primary) hypertension 10/15/2017  . Patient's noncompliance with other medical treatment and regimen 10/15/2017  . Diarrhea, unspecified 10/15/2017  . Other fatigue 10/15/2017  . Type 2 diabetes mellitus with hyperglycemia ([place]) 10/15/2017  . Personal history of malignant neoplasm of breast 10/15/2017  . Low back pain 10/15/2017  . Hyperlipidemia 10/15/2017   Past Medical History:  Diagnosis Date  . Cancer [facility])    left breast s/p left lumpectomy  . Diabetes mellitus without complication ([place])   . Hypertension     History reviewed. No pertinent family history.  Past Surgical History:  Procedure Laterality Date  . BREAST SURGERY  2010   left breast tumor removal   Social History   Occupational History  . Not on file  Tobacco Use  . Smoking status: Former Smoker    Types: Cigarettes  . Smokeless tobacco: Never Used  Substance and Sexual Activity  . Alcohol use: Yes    Comment: ocassionally  . Drug use: No  .  Sexual activity: Never

## 2019-09-12 ENCOUNTER — Encounter: Payer: Self-pay | Admitting: Orthopaedic Surgery

## 2019-09-12 ENCOUNTER — Ambulatory Visit (INDEPENDENT_AMBULATORY_CARE_PROVIDER_SITE_OTHER): Payer: BC Managed Care – PPO | Admitting: Orthopaedic Surgery

## 2019-09-12 ENCOUNTER — Ambulatory Visit: Payer: Self-pay

## 2019-09-12 ENCOUNTER — Other Ambulatory Visit: Payer: Self-pay

## 2019-09-12 DIAGNOSIS — S42331A Displaced oblique fracture of shaft of humerus, right arm, initial encounter for closed fracture: Secondary | ICD-10-CM | POA: Diagnosis not present

## 2019-09-12 NOTE — Progress Notes (Signed)
   Office Visit Note   Patient: Kristie Bowman           Date of Birth: [birth year]           MRN: [id number] Visit Date: 09/12/2019              Requested by: Kristie Freshwater, NP [address] [place],  [place] [address] PCP: Kristie Freshwater, NP   Assessment & Plan: Visit Diagnoses:  1. Closed displaced oblique fracture of shaft of right humerus, initial encounter     Plan: Impression is stable alignment of right humerus fracture.  We will continue nonweightbearing with a Sarmiento and sling.  Continue with gentle elbow range of motion.  Recheck in 2 weeks with two-view x-rays of the right humerus in the Sarmiento.  We will probably consider discontinuing the sling at that time if the x-rays are acceptable.  Follow-Up Instructions: Return in about 2 weeks (around 09/26/2019).   Orders:  Orders Placed This Encounter  Procedures  . XR Humerus Right   No orders of the defined types were placed in this encounter.     Procedures: No procedures performed   Clinical Data: No additional findings.   Subjective: Chief Complaint  Patient presents with  . Right Shoulder - Pain    Kristie Bowman follows up today for her right humerus fracture.  She is about 2 weeks from the injury.  She is feeling better.  She is tolerating a Sarmiento brace.   Review of Systems   Objective: Vital Signs: There were no vitals taken for this visit.  Physical Exam  Ortho Exam Right upper extremity shows a well fitting Sarmiento.  No neurovascular compromise. Specialty Comments:  No specialty comments available.  Imaging: Xr Humerus Right  Result Date: 09/12/2019 Slightly improved alignment of the humerus fracture    PMFS History: Patient Active Problem List   Diagnosis Date Noted  . Closed displaced oblique fracture of shaft of right humerus 08/31/2019  . Intractable vomiting 01/17/2019  . Acute intractable headache 01/17/2019  . Uncontrolled type 2 diabetes mellitus with  hyperglycemia ([place]) 07/18/2018  . Needs flu shot 07/18/2018  . Screening for colon cancer 07/18/2018  . Type 2 diabetes mellitus with mild nonproliferative diabetic retinopathy with macular edema, bilateral ([place]) 10/15/2017  . Essential (primary) hypertension 10/15/2017  . Patient's noncompliance with other medical treatment and regimen 10/15/2017  . Diarrhea, unspecified 10/15/2017  . Other fatigue 10/15/2017  . Type 2 diabetes mellitus with hyperglycemia ([place]) 10/15/2017  . Personal history of malignant neoplasm of breast 10/15/2017  . Low back pain 10/15/2017  . Hyperlipidemia 10/15/2017   Past Medical History:  Diagnosis Date  . Cancer [facility])    left breast s/p left lumpectomy  . Diabetes mellitus without complication ([place])   . Hypertension     History reviewed. No pertinent family history.  Past Surgical History:  Procedure Laterality Date  . BREAST SURGERY  2010   left breast tumor removal   Social History   Occupational History  . Not on file  Tobacco Use  . Smoking status: Former Smoker    Types: Cigarettes  . Smokeless tobacco: Never Used  Substance and Sexual Activity  . Alcohol use: Yes    Comment: ocassionally  . Drug use: No  . Sexual activity: Never

## 2019-09-29 ENCOUNTER — Encounter: Payer: Self-pay | Admitting: Orthopaedic Surgery

## 2019-09-29 ENCOUNTER — Ambulatory Visit: Payer: BC Managed Care – PPO

## 2019-09-29 ENCOUNTER — Other Ambulatory Visit: Payer: Self-pay

## 2019-09-29 ENCOUNTER — Ambulatory Visit (INDEPENDENT_AMBULATORY_CARE_PROVIDER_SITE_OTHER): Payer: BC Managed Care – PPO | Admitting: Orthopaedic Surgery

## 2019-09-29 VITALS — Ht 66.5 in | Wt 149.0 lb

## 2019-09-29 DIAGNOSIS — S42331A Displaced oblique fracture of shaft of humerus, right arm, initial encounter for closed fracture: Secondary | ICD-10-CM

## 2019-09-29 NOTE — Progress Notes (Signed)
    Patient: Kristie Bowman           Date of Birth: 1960/11/10           MRN: [id number] Visit Date: 09/29/2019 PCP: Ronnell Freshwater, NP   Assessment & Plan:  Chief Complaint:  Chief Complaint  Patient presents with  . Arm Injury    Right humerus fracture follow up   Visit Diagnoses:  1. Closed displaced oblique fracture of shaft of right humerus, initial encounter     Plan: Kristie Bowman is approximately 5-week status post right humeral shaft fracture treated with a Sarmiento.  She is overall doing well and she has noticed progressive improvement every week.  She does feel some achy discomfort at night.  She has been very compliant with the Sarmiento brace.  Her x-rays demonstrate significant healing of the fracture and bony consolidation.  I readjusted the Sarmiento today.  We can discontinue the shoulder sling.  Prescription for OT was provided today Nicole [facility]red.  Recheck in 4 weeks with two-view x-rays of the right humerus out of the Sarmiento.  Questions encouraged and answered.  I anticipate that she would be able to go back to work doing light duty in January some time.  Follow-Up Instructions: Return in about 4 weeks (around 10/27/2019).   Orders:  Orders Placed This Encounter  Procedures  . XR Humerus Right   No orders of the defined types were placed in this encounter.   Imaging: XR Humerus Right  Result Date: 09/29/2019 Stable alignment of proximal humeral shaft fracture.  There is evidence of healing and bony consolidation.   PMFS History: Patient Active Problem List   Diagnosis Date Noted  . Closed displaced oblique fracture of shaft of right humerus 08/31/2019  . Intractable vomiting 01/17/2019  . Acute intractable headache 01/17/2019  . Uncontrolled type 2 diabetes mellitus with hyperglycemia ([place]) 07/18/2018  . Needs flu shot 07/18/2018  . Screening for colon cancer 07/18/2018  . Type 2 diabetes mellitus with mild nonproliferative diabetic retinopathy with  macular edema, bilateral ([place]) 10/15/2017  . Essential (primary) hypertension 10/15/2017  . Patient's noncompliance with other medical treatment and regimen 10/15/2017  . Diarrhea, unspecified 10/15/2017  . Other fatigue 10/15/2017  . Type 2 diabetes mellitus with hyperglycemia ([place]) 10/15/2017  . Personal history of malignant neoplasm of breast 10/15/2017  . Low back pain 10/15/2017  . Hyperlipidemia 10/15/2017   Past Medical History:  Diagnosis Date  . Cancer [facility])    left breast s/p left lumpectomy  . Diabetes mellitus without complication ([place])   . Hypertension     History reviewed. No pertinent family history.  Past Surgical History:  Procedure Laterality Date  . BREAST SURGERY  2010   left breast tumor removal   Social History   Occupational History  . Not on file  Tobacco Use  . Smoking status: Former Smoker    Types: Cigarettes  . Smokeless tobacco: Never Used  Substance and Sexual Activity  . Alcohol use: Yes    Comment: ocassionally  . Drug use: No  . Sexual activity: Never

## 2019-10-10 DIAGNOSIS — M25511 Pain in right shoulder: Secondary | ICD-10-CM | POA: Diagnosis not present

## 2019-10-17 ENCOUNTER — Ambulatory Visit: Payer: BC Managed Care – PPO | Attending: Internal Medicine

## 2019-10-17 DIAGNOSIS — Z20822 Contact with and (suspected) exposure to covid-19: Secondary | ICD-10-CM

## 2019-10-17 DIAGNOSIS — Z20828 Contact with and (suspected) exposure to other viral communicable diseases: Secondary | ICD-10-CM | POA: Diagnosis not present

## 2019-10-18 ENCOUNTER — Encounter: Payer: Self-pay | Admitting: Adult Health

## 2019-10-18 ENCOUNTER — Telehealth: Payer: Self-pay | Admitting: Orthopaedic Surgery

## 2019-10-18 ENCOUNTER — Ambulatory Visit: Payer: BC Managed Care – PPO | Admitting: Adult Health

## 2019-10-18 ENCOUNTER — Other Ambulatory Visit: Payer: Self-pay

## 2019-10-18 VITALS — Temp 97.8°F | Ht 66.0 in | Wt 148.0 lb

## 2019-10-18 DIAGNOSIS — R509 Fever, unspecified: Secondary | ICD-10-CM | POA: Diagnosis not present

## 2019-10-18 DIAGNOSIS — I10 Essential (primary) hypertension: Secondary | ICD-10-CM | POA: Diagnosis not present

## 2019-10-18 DIAGNOSIS — Z20828 Contact with and (suspected) exposure to other viral communicable diseases: Secondary | ICD-10-CM

## 2019-10-18 LAB — NOVEL CORONAVIRUS, NAA: SARS-CoV-2, NAA: DETECTED — AB

## 2019-10-18 MED ORDER — PREDNISONE 10 MG PO TABS: ORAL_TABLET | ORAL | 0 refills | Status: DC

## 2019-10-18 MED ORDER — AZITHROMYCIN 250 MG PO TABS: ORAL_TABLET | ORAL | 0 refills | Status: DC

## 2019-10-18 NOTE — Telephone Encounter (Signed)
09/29/19 ov note faxed to [employer] [phone]

## 2019-10-18 NOTE — Progress Notes (Signed)
[facility] [place], [place] [address]  Internal MEDICINE  Telephone Visit  Patient Name: Kristie Bowman  [id number]  [id number]  Date of Service: 10/18/2019  I connected with the patient at 1119 by telephone and verified the patients identity using two identifiers.   I discussed the limitations, risks, security and privacy concerns of performing an evaluation and management service by telephone and the availability of in person appointments. I also discussed with the patient that there may be a patient responsible charge related to the service.  The patient expressed understanding and agrees to proceed.    Chief Complaint  Patient presents with  . Telephone Assessment    no test results, symptoms have been going on for 5 days  . Telephone Screen  . Fatigue  . Chills    evening time, leads to fever  . Fever  . Loss of Taste and Smell  . Nasal Congestion    HPI  Pt seen via telephone.  She reports having care givers in and out of the house for her husband who is on hospice.  She reports on christmas day a caregiver called and reported she had tested positive.  Two days later the patient noticed she started having fever and chills at night.  She has lost her smell and taste.   She was tested for covid yesterday and is awaiting results. She reports mild cough, or chest pain.  She reports some mild sob with exertion.    Current Medication: Outpatient Encounter Medications as of 10/18/2019  Medication Sig  . Accu-Chek FastClix Lancets MISC Use as directed  Twice day E11.65  . butalbital-acetaminophen-caffeine (FIORICET, ESGIC) 50-325-40 MG tablet Take 1-2 tablets by mouth every 6 (six) hours as needed for headache.  . Continuous Blood Gluc Receiver (FREESTYLE LIBRE 14 DAY READER) DEVI 1 Units by Does not apply route daily.  Marland Kitchen glucose blood (ACCU-CHEK GUIDE) test strip Blood sugar testing TID - dx E11.65  . HYDROcodone-acetaminophen (NORCO) 5-325 MG tablet  Take 1-2 tablets by mouth 3 (three) times daily as needed for moderate pain.  Marland Kitchen ibuprofen (ADVIL) 800 MG tablet Take 1 tablet (800 mg total) by mouth every 8 (eight) hours as needed.  . Insulin Glargine, 2 Unit Dial, (TOUJEO MAX SOLOSTAR) 300 UNIT/ML SOPN Inject 10 Units into the skin daily.  . Insulin Pen Needle (FIFTY50 PEN NEEDLES) 32G X 4 MM MISC Use with Toujeo and victoza injections. Will need 2 needles per day. E11.65  . liraglutide (VICTOZA) 18 MG/3ML SOPN Inject 0.3 mLs (1.8 mg total) into the skin daily. Please add to patient's med list.  . lisinopril (PRINIVIL,ZESTRIL) 5 MG tablet Take 1 tablet (5 mg total) by mouth daily. Please add on patient's med list  . ondansetron (ZOFRAN ODT) 4 MG disintegrating tablet 4mg  ODT q4 hours prn nausea/vomit  . ondansetron (ZOFRAN) 4 MG tablet Take 1 tablet (4 mg total) by mouth every 8 (eight) hours as needed for nausea or vomiting.  Marland Kitchen oxyCODONE-acetaminophen (PERCOCET) 5-325 MG tablet Take 1 tablet by mouth every 6 (six) hours as needed.  . rosuvastatin (CRESTOR) 5 MG tablet Take 1 tablet (5 mg total) by mouth at bedtime.  Marland Kitchen azithromycin (ZITHROMAX) 250 MG tablet Take as directed  . predniSONE (DELTASONE) 10 MG tablet Use per dose pack   No facility-administered encounter medications on file as of 10/18/2019.    Surgical History: Past Surgical History:  Procedure Laterality Date  . BREAST SURGERY  2010   left  breast tumor removal    Medical History: Past Medical History:  Diagnosis Date  . Cancer [facility])    left breast s/p left lumpectomy  . Diabetes mellitus without complication ([place])   . Hypertension     Family History: History reviewed. No pertinent family history.  Social History   Socioeconomic History  . Marital status: Married    Spouse name: Not on file  . Number of children: Not on file  . Years of education: Not on file  . Highest education level: Not on file  Occupational History  . Not on file  Tobacco Use  .  Smoking status: Former Smoker    Types: Cigarettes  . Smokeless tobacco: Never Used  Substance and Sexual Activity  . Alcohol use: Yes    Comment: ocassionally  . Drug use: No  . Sexual activity: Never  Other Topics Concern  . Not on file  Social History Narrative  . Not on file   Social Determinants of Health   Financial Resource Strain:   . Difficulty of Paying Living Expenses: Not on file  Food Insecurity:   . Worried About Charity fundraiser in the Last Year: Not on file  . Ran Out of Food in the Last Year: Not on file  Transportation Needs:   . Lack of Transportation (Medical): Not on file  . Lack of Transportation (Non-Medical): Not on file  Physical Activity:   . Days of Exercise per Week: Not on file  . Minutes of Exercise per Session: Not on file  Stress:   . Feeling of Stress : Not on file  Social Connections:   . Frequency of Communication with Friends and Family: Not on file  . Frequency of Social Gatherings with Friends and Family: Not on file  . Attends Religious Services: Not on file  . Active Member of Clubs or Organizations: Not on file  . Attends Archivist Meetings: Not on file  . Marital Status: Not on file  Intimate Partner Violence:   . Fear of Current or Ex-Partner: Not on file  . Emotionally Abused: Not on file  . Physically Abused: Not on file  . Sexually Abused: Not on file      Review of Systems  Constitutional: Positive for chills and fever. Negative for fatigue and unexpected weight change.       Loss of taste and smell  HENT: Negative for congestion, rhinorrhea, sneezing and sore throat.   Eyes: Negative for photophobia, pain and redness.  Respiratory: Positive for cough. Negative for chest tightness and shortness of breath.   Cardiovascular: Negative for chest pain and palpitations.  Gastrointestinal: Negative for abdominal pain, constipation, diarrhea, nausea and vomiting.  Endocrine: Negative.   Genitourinary: Negative  for dysuria and frequency.  Musculoskeletal: Negative for arthralgias, back pain, joint swelling and neck pain.  Skin: Negative for rash.  Allergic/Immunologic: Negative.   Neurological: Negative for tremors and numbness.  Hematological: Negative for adenopathy. Does not bruise/bleed easily.  Psychiatric/Behavioral: Negative for behavioral problems and sleep disturbance. The patient is not nervous/anxious.     Vital Signs: Temp 97.8 F (36.6 C)   Ht 5\' 6"  (1.676 m)   Wt 148 lb (67.1 kg)   BMI 23.89 kg/m    Observation/Objective:  Well sounding, NAD noted.    Assessment/Plan: 1. Fever with exposure to COVID-19 virus Advised patient to take entire course of antibiotics as prescribed with food. Pt should return to clinic in 7-10 days if symptoms fail to  improve or new symptoms develop.  - azithromycin (ZITHROMAX) 250 MG tablet; Take as directed  Dispense: 6 tablet; Refill: 0 - predniSONE (DELTASONE) 10 MG tablet; Use per dose pack  Dispense: 21 tablet; Refill: 0  2. Essential (primary) hypertension Stable, continue present management.   General Counseling: esmie romrell understanding of the findings of today's phone visit and agrees with plan of treatment. I have discussed any further diagnostic evaluation that may be needed or ordered today. We also reviewed her medications today. she has been encouraged to call the office with any questions or concerns that should arise related to todays visit.    No orders of the defined types were placed in this encounter.   Meds ordered this encounter  Medications  . azithromycin (ZITHROMAX) 250 MG tablet    Sig: Take as directed    Dispense:  6 tablet    Refill:  0  . predniSONE (DELTASONE) 10 MG tablet    Sig: Use per dose pack    Dispense:  21 tablet    Refill:  0    Time spent: [place] AGNP-C Internal medicine

## 2019-10-19 ENCOUNTER — Encounter: Payer: Self-pay | Admitting: *Deleted

## 2019-10-23 ENCOUNTER — Telehealth: Payer: Self-pay

## 2019-10-23 NOTE — Telephone Encounter (Signed)
Bless her, Did she sound Ok?  Her husband is in hospice care (in the home)

## 2019-10-25 ENCOUNTER — Emergency Department: Payer: BC Managed Care – PPO

## 2019-10-25 ENCOUNTER — Other Ambulatory Visit: Payer: Self-pay

## 2019-10-25 ENCOUNTER — Emergency Department
Admission: EM | Admit: 2019-10-25 | Discharge: 2019-10-25 | Disposition: A | Discharge status: Home / Self Care | Payer: BC Managed Care – PPO | Attending: Emergency Medicine | Admitting: Emergency Medicine

## 2019-10-25 ENCOUNTER — Encounter: Payer: Self-pay | Admitting: Emergency Medicine

## 2019-10-25 DIAGNOSIS — E119 Type 2 diabetes mellitus without complications: Secondary | ICD-10-CM | POA: Insufficient documentation

## 2019-10-25 DIAGNOSIS — Z87891 Personal history of nicotine dependence: Secondary | ICD-10-CM | POA: Insufficient documentation

## 2019-10-25 DIAGNOSIS — Z853 Personal history of malignant neoplasm of breast: Secondary | ICD-10-CM | POA: Insufficient documentation

## 2019-10-25 DIAGNOSIS — I10 Essential (primary) hypertension: Secondary | ICD-10-CM | POA: Insufficient documentation

## 2019-10-25 DIAGNOSIS — E113213 Type 2 diabetes mellitus with mild nonproliferative diabetic retinopathy with macular edema, bilateral: Secondary | ICD-10-CM | POA: Insufficient documentation

## 2019-10-25 DIAGNOSIS — U071 COVID-19: Secondary | ICD-10-CM

## 2019-10-25 LAB — CBC WITH DIFFERENTIAL/PLATELET
Abs Immature Granulocytes: 0.02 10*3/uL (ref 0.00–0.07)
Basophils Absolute: 0 10*3/uL (ref 0.0–0.1)
Basophils Relative: 0 %
Eosinophils Absolute: 0 10*3/uL (ref 0.0–0.5)
Eosinophils Relative: 1 %
HCT: 39.3 % (ref 36.0–46.0)
Hemoglobin: 13.5 g/dL (ref 12.0–15.0)
Immature Granulocytes: 0 %
Lymphocytes Relative: 12 %
Lymphs Abs: 0.6 10*3/uL — ABNORMAL LOW (ref 0.7–4.0)
MCH: 28.6 pg (ref 26.0–34.0)
MCHC: 34.4 g/dL (ref 30.0–36.0)
MCV: 83.3 fL (ref 80.0–100.0)
Monocytes Absolute: 0.7 10*3/uL (ref 0.1–1.0)
Monocytes Relative: 14 %
Neutro Abs: 3.7 10*3/uL (ref 1.7–7.7)
Neutrophils Relative %: 73 %
Platelets: 263 10*3/uL (ref 150–400)
RBC: 4.72 MIL/uL (ref 3.87–5.11)
RDW: 11.7 % (ref 11.5–15.5)
WBC: 5.1 10*3/uL (ref 4.0–10.5)
nRBC: 0 % (ref 0.0–0.2)

## 2019-10-25 LAB — COMPREHENSIVE METABOLIC PANEL
ALT: 23 U/L (ref 0–44)
AST: 20 U/L (ref 15–41)
Albumin: 3.2 g/dL — ABNORMAL LOW (ref 3.5–5.0)
Alkaline Phosphatase: 83 U/L (ref 38–126)
Anion gap: 12 (ref 5–15)
BUN: 12 mg/dL (ref 6–20)
CO2: 26 mmol/L (ref 22–32)
Calcium: 8.7 mg/dL — ABNORMAL LOW (ref 8.9–10.3)
Chloride: 99 mmol/L (ref 98–111)
Creatinine, Ser: 0.63 mg/dL (ref 0.44–1.00)
GFR calc Af Amer: >60 mL/min (ref >60)
GFR calc non Af Amer: >60 mL/min (ref >60)
Glucose, Bld: 276 mg/dL — ABNORMAL HIGH (ref 70–99)
Potassium: 3.4 mmol/L — ABNORMAL LOW (ref 3.5–5.1)
Sodium: 137 mmol/L (ref 135–145)
Total Bilirubin: 0.7 mg/dL (ref 0.3–1.2)
Total Protein: 7.2 g/dL (ref 6.5–8.1)

## 2019-10-25 LAB — FIBRIN DERIVATIVES D-DIMER (ARMC ONLY): Fibrin derivatives D-dimer (ARMC): 424.05 ng/mL (FEU) (ref 0.00–499.00)

## 2019-10-25 LAB — FIBRINOGEN: Fibrinogen: 735 mg/dL — ABNORMAL HIGH (ref 210–475)

## 2019-10-25 LAB — PROCALCITONIN: Procalcitonin: <0.1 ng/mL

## 2019-10-25 LAB — LACTATE DEHYDROGENASE: LDH: 174 U/L (ref 98–192)

## 2019-10-25 LAB — LACTIC ACID, PLASMA: Lactic Acid, Venous: 1 mmol/L (ref 0.5–1.9)

## 2019-10-25 LAB — C-REACTIVE PROTEIN: CRP: 12.4 mg/dL — ABNORMAL HIGH (ref <1.0)

## 2019-10-25 LAB — FERRITIN: Ferritin: 1185 ng/mL — ABNORMAL HIGH (ref 11–307)

## 2019-10-25 MED ORDER — DEXAMETHASONE SODIUM PHOSPHATE 10 MG/ML IJ SOLN
10.0000 mg | Freq: Once | INTRAMUSCULAR | Status: AC
  Administered 2019-10-25: 10:00:00 10 mg via INTRAVENOUS
  Filled 2019-10-25 (×2): qty 1

## 2019-10-25 MED ORDER — AZITHROMYCIN 250 MG PO TABS: ORAL_TABLET | ORAL | 0 refills | Status: DC

## 2019-10-25 MED ORDER — IVERMECTIN 3 MG PO TABS
200.0000 ug/kg | ORAL_TABLET | Freq: Once | ORAL | Status: AC
  Administered 2019-10-25: 13500 ug via ORAL
  Filled 2019-10-25: qty 5

## 2019-10-25 MED ORDER — SODIUM CHLORIDE 0.9 % IV BOLUS
500.0000 mL | Freq: Once | INTRAVENOUS | Status: AC
  Administered 2019-10-25: 500 mL via INTRAVENOUS

## 2019-10-25 MED ORDER — IVERMECTIN 3 MG PO TABS: 200.0000 ug/kg | ORAL_TABLET | Freq: Once | ORAL | 0 refills | Status: AC

## 2019-10-25 MED ORDER — HYDROCOD POLST-CPM POLST ER 10-8 MG/5ML PO SUER: 5.0000 mL | Freq: Two times a day (BID) | ORAL | 0 refills | Status: DC

## 2019-10-25 NOTE — ED Notes (Signed)
Pt has right upper arm brace intact. States its from a fall while walking her dog a few weeks ago, states it is healing and no needs for her arm at this time.

## 2019-10-25 NOTE — ED Notes (Signed)
Handoff given to alicia, rn 

## 2019-10-25 NOTE — ED Provider Notes (Signed)
[facility] Emergency Department Provider Note       Time seen: ----------------------------------------- 8:46 AM on 10/25/2019 -----------------------------------------   I have reviewed the triage vital signs and the nursing notes.  HISTORY   Chief Complaint No chief complaint on file.    HPI Kristie Bowman is a 59 y.o. female with a history of cancer, diabetes, hypertension who presents to the ED for shortness of breath and dyspnea on exertion.  Reportedly she is Covid positive and reportedly husband is on hospice for dementia.  Patient states when she gets up to take care of them she gets very short of breath.  She has had poor appetite.  Blood sugar was noted to be elevated as well.  Past Medical History:  Diagnosis Date  . Cancer [facility])    left breast s/p left lumpectomy  . Diabetes mellitus without complication ([place])   . Hypertension     Patient Active Problem List   Diagnosis Date Noted  . Closed displaced oblique fracture of shaft of right humerus 08/31/2019  . Intractable vomiting 01/17/2019  . Acute intractable headache 01/17/2019  . Uncontrolled type 2 diabetes mellitus with hyperglycemia ([place]) 07/18/2018  . Needs flu shot 07/18/2018  . Screening for colon cancer 07/18/2018  . Type 2 diabetes mellitus with mild nonproliferative diabetic retinopathy with macular edema, bilateral ([place]) 10/15/2017  . Essential (primary) hypertension 10/15/2017  . Patient's noncompliance with other medical treatment and regimen 10/15/2017  . Diarrhea, unspecified 10/15/2017  . Other fatigue 10/15/2017  . Type 2 diabetes mellitus with hyperglycemia ([place]) 10/15/2017  . Personal history of malignant neoplasm of breast 10/15/2017  . Low back pain 10/15/2017  . Hyperlipidemia 10/15/2017    Past Surgical History:  Procedure Laterality Date  . BREAST SURGERY  2010   left breast tumor removal    Allergies Codeine  Social History Social History    Tobacco Use  . Smoking status: Former Smoker    Types: Cigarettes  . Smokeless tobacco: Never Used  Substance Use Topics  . Alcohol use: Yes    Comment: ocassionally  . Drug use: No   Review of Systems Constitutional: Negative for fever. Cardiovascular: Negative for chest pain. Respiratory: Positive for shortness of breath Gastrointestinal: Negative for abdominal pain, vomiting and diarrhea. Musculoskeletal: Negative for back pain. Skin: Negative for rash. Neurological: Negative for headaches, focal weakness or numbness.  All systems negative/normal/unremarkable except as stated in the HPI  ____________________________________________   PHYSICAL EXAM:  VITAL SIGNS: ED Triage Vitals  Enc Vitals Group     BP      Pulse      Resp      Temp      Temp src      SpO2      Weight      Height      Head Circumference      Peak Flow      Pain Score      Pain Loc      Pain Edu?      Excl. in [place]?    Constitutional: Alert and oriented.  Mild distress Eyes: Conjunctivae are normal. Normal extraocular movements. ENT      Head: Normocephalic and atraumatic.      Nose: No congestion/rhinnorhea.      Mouth/Throat: Mucous membranes are moist.      Neck: No stridor. Cardiovascular: Normal rate, regular rhythm. No murmurs, rubs, or gallops. Respiratory: Normal respiratory effort without tachypnea nor retractions. Breath sounds are clear  and equal bilaterally. No wheezes/rales/rhonchi. Gastrointestinal: Soft and nontender. Normal bowel sounds Musculoskeletal: Nontender with normal range of motion in extremities. No lower extremity tenderness nor edema. Neurologic:  Normal speech and language. No gross focal neurologic deficits are appreciated.  Skin:  Skin is warm, dry and intact. No rash noted. Psychiatric: Depressed mood and affect ____________________________________________  EKG: Interpreted by me.  Sinus tachycardia with rate of 105 bpm, normal PR interval, normal QRS,  normal QT  ____________________________________________  ED COURSE:  As part of my medical decision making, I reviewed the following data within the [place] History obtained from family if available, nursing notes, old chart and ekg, as well as notes from prior ED visits. Patient presented for weakness and shortness of breath in the setting of COVID-19, we will assess with labs and imaging as indicated at this time.   Procedures  Kristie Bowman was evaluated in Emergency Department on 10/25/2019 for the symptoms described in the history of present illness. She was evaluated in the context of the global COVID-19 pandemic, which necessitated consideration that the patient might be at risk for infection with the SARS-CoV-2 virus that causes COVID-19. Institutional protocols and algorithms that pertain to the evaluation of patients at risk for COVID-19 are in a state of rapid change based on information released by regulatory bodies including the CDC and federal and state organizations. These policies and algorithms were followed during the patient's care in the ED.  ____________________________________________   LABS (pertinent positives/negatives)  Labs Reviewed  CBC WITH DIFFERENTIAL/PLATELET - Abnormal; Notable for the following components:      Result Value   Lymphs Abs 0.6 (*)    All other components within normal limits  COMPREHENSIVE METABOLIC PANEL - Abnormal; Notable for the following components:   Potassium 3.4 (*)    Glucose, Bld 276 (*)    Calcium 8.7 (*)    Albumin 3.2 (*)    All other components within normal limits  FERRITIN - Abnormal; Notable for the following components:   Ferritin 1,185 (*)    All other components within normal limits  FIBRINOGEN - Abnormal; Notable for the following components:   Fibrinogen 735 (*)    All other components within normal limits  CULTURE, BLOOD (ROUTINE X 2)  CULTURE, BLOOD (ROUTINE X 2)  LACTIC ACID, PLASMA   FIBRIN DERIVATIVES D-DIMER ([facility] ONLY)  PROCALCITONIN  LACTATE DEHYDROGENASE  C-REACTIVE PROTEIN   CRITICAL CARE Performed by: Laurence Aly   Total critical care time: 30 minutes  Critical care time was exclusive of separately billable procedures and treating other patients.  Critical care was necessary to treat or prevent imminent or life-threatening deterioration.  Critical care was time spent personally by me on the following activities: development of treatment plan with patient and/or surrogate as well as nursing, discussions with consultants, evaluation of patient's response to treatment, examination of patient, obtaining history from patient or surrogate, ordering and performing treatments and interventions, ordering and review of laboratory studies, ordering and review of radiographic studies, pulse oximetry and re-evaluation of patient's condition.  RADIOLOGY Images were viewed by me  Chest x-ray IMPRESSION:  Patchy bilateral lower lobe airspace opacities concerning for  pneumonia.  ____________________________________________   DIFFERENTIAL DIAGNOSIS   COVID-19, hypoxia, pneumonia, dehydration, electrolyte abnormality, DKA  FINAL ASSESSMENT AND PLAN  COVID-19, hyperglycemia   Plan: The patient had presented for shortness of breath. Patient's labs look as expected for COVID-19. Patient's imaging was consistent with COVID-19  with patchy  bilateral lower lobe airspace opacities.  She ambulated with a 92% pulse oximetry.  She appears cleared for discharge at this time.   Laurence Aly, MD    Note: This note was generated in part or whole with voice recognition software. Voice recognition is usually quite accurate but there are transcription errors that can and very often do occur. I apologize for any typographical errors that were not detected and corrected.     Earleen Newport, MD 10/25/19 1302

## 2019-10-25 NOTE — ED Notes (Signed)
Pt ambulated in room off oxygen. Lowest O2 sat 92% on RA, quickly changing to 93% when seated.

## 2019-10-25 NOTE — ED Notes (Signed)

## 2019-10-25 NOTE — ED Notes (Signed)
Resting in bed, no complaints at this time, warm blanket given.

## 2019-10-25 NOTE — ED Triage Notes (Signed)
Pt here via EMS from home with c/o increasing shob over the past few days, Covid + on 10/19/19. Sats 91% on RA, 2L per [place] applied. Pt with dry cough on arrival, no respiratory distress at this time. Has been weak the past few days as well, afebrile on assessment.

## 2019-10-27 ENCOUNTER — Ambulatory Visit: Payer: BC Managed Care – PPO | Admitting: Orthopaedic Surgery

## 2019-10-30 LAB — CULTURE, BLOOD (ROUTINE X 2)
Culture: NO GROWTH
Culture: NO GROWTH
Special Requests: ADEQUATE

## 2019-11-02 ENCOUNTER — Ambulatory Visit (INDEPENDENT_AMBULATORY_CARE_PROVIDER_SITE_OTHER): Payer: Managed Care, Other (non HMO)

## 2019-11-02 ENCOUNTER — Encounter: Payer: Self-pay | Admitting: Nurse Practitioner

## 2019-11-02 ENCOUNTER — Other Ambulatory Visit: Payer: Self-pay

## 2019-11-02 ENCOUNTER — Ambulatory Visit (INDEPENDENT_AMBULATORY_CARE_PROVIDER_SITE_OTHER): Payer: Managed Care, Other (non HMO) | Admitting: Orthopaedic Surgery

## 2019-11-02 ENCOUNTER — Ambulatory Visit: Payer: Managed Care, Other (non HMO) | Admitting: Nurse Practitioner

## 2019-11-02 VITALS — Temp 98.6°F | Ht 66.0 in | Wt 138.0 lb

## 2019-11-02 DIAGNOSIS — S42331A Displaced oblique fracture of shaft of humerus, right arm, initial encounter for closed fracture: Secondary | ICD-10-CM | POA: Diagnosis not present

## 2019-11-02 DIAGNOSIS — U071 COVID-19: Secondary | ICD-10-CM

## 2019-11-02 DIAGNOSIS — Z794 Long term (current) use of insulin: Secondary | ICD-10-CM

## 2019-11-02 DIAGNOSIS — I10 Essential (primary) hypertension: Secondary | ICD-10-CM | POA: Diagnosis not present

## 2019-11-02 DIAGNOSIS — J1282 Pneumonia due to coronavirus disease 2019: Secondary | ICD-10-CM | POA: Diagnosis not present

## 2019-11-02 DIAGNOSIS — E1165 Type 2 diabetes mellitus with hyperglycemia: Secondary | ICD-10-CM

## 2019-11-02 MED ORDER — TOUJEO MAX SOLOSTAR 300 UNIT/ML ~~LOC~~ SOPN: 15.0000 [IU] | PEN_INJECTOR | Freq: Every day | SUBCUTANEOUS | 3 refills | Status: DC

## 2019-11-02 NOTE — Progress Notes (Signed)
[facility] [place], [place] [address]  Internal MEDICINE  Telephone Visit  Patient Name: Kristie Bowman  [id number]  [id number]  Date of Service: 11/02/2019  I connected with the patient at 4:24pm by webcam and verified the patients identity using two identifiers.   I discussed the limitations, risks, security and privacy concerns of performing an evaluation and management service by webcam and the availability of in person appointments. I also discussed with the patient that there may be a patient responsible charge related to the service.  The patient expressed understanding and agrees to proceed.    Chief Complaint  Patient presents with  . Telephone Assessment    pt had labs and chest xray(showed pneumonia) because she was in the hospital for covid, did not take prednisone we gave her and was given two z paks, second one she did not take, wants to know if she should; never took ivermectin 35 mg given at the hospital   . Telephone Screen  . Diabetes  . Hypertension    The patient has been contacted via webcam for follow up visit due to concerns for spread of novel coronavirus. The patient presents for follow up visit. Since her last visit, she fell and broke her arm on August 26, 2019. She recently saw orthopedics to have her last splint removed. She was also diagnosed with COVID 19 just after christmas. She was evaluated in the ER on 10/25/2019 due to weakness and shortness of breath. Chest x-ray was positive for pneumonia. She was given antibiotics, fluids, and oxygen. They did not admit her and there were no beds available Blood sugars have been running in the 300s. States that she did not take diabetic shots at all while sick. Just recently started taking them again. She is currently taking 0.6mg  victoza daily and 10units of toujeo everyday.       Current Medication: Outpatient Encounter Medications as of 11/02/2019  Medication Sig  . Accu-Chek FastClix  Lancets MISC Use as directed  Twice day E11.65  . butalbital-acetaminophen-caffeine (FIORICET, ESGIC) 50-325-40 MG tablet Take 1-2 tablets by mouth every 6 (six) hours as needed for headache.  . chlorpheniramine-HYDROcodone (TUSSIONEX PENNKINETIC ER) 10-8 MG/5ML SUER Take 5 mLs by mouth 2 (two) times daily.  . Continuous Blood Gluc Receiver (FREESTYLE LIBRE 14 DAY READER) DEVI 1 Units by Does not apply route daily.  Marland Kitchen glucose blood (ACCU-CHEK GUIDE) test strip Blood sugar testing TID - dx E11.65  . HYDROcodone-acetaminophen (NORCO) 5-325 MG tablet Take 1-2 tablets by mouth 3 (three) times daily as needed for moderate pain.  Marland Kitchen ibuprofen (ADVIL) 800 MG tablet Take 1 tablet (800 mg total) by mouth every 8 (eight) hours as needed.  . Insulin Glargine, 2 Unit Dial, (TOUJEO MAX SOLOSTAR) 300 UNIT/ML SOPN Inject 15 Units into the skin daily.  . Insulin Pen Needle (FIFTY50 PEN NEEDLES) 32G X 4 MM MISC Use with Toujeo and victoza injections. Will need 2 needles per day. E11.65  . liraglutide (VICTOZA) 18 MG/3ML SOPN Inject 0.3 mLs (1.8 mg total) into the skin daily. Please add to patient's med list.  . lisinopril (PRINIVIL,ZESTRIL) 5 MG tablet Take 1 tablet (5 mg total) by mouth daily. Please add on patient's med list  . ondansetron (ZOFRAN ODT) 4 MG disintegrating tablet 4mg  ODT q4 hours prn nausea/vomit  . ondansetron (ZOFRAN) 4 MG tablet Take 1 tablet (4 mg total) by mouth every 8 (eight) hours as needed for nausea or vomiting.  Marland Kitchen oxyCODONE-acetaminophen (  PERCOCET) 5-325 MG tablet Take 1 tablet by mouth every 6 (six) hours as needed.  . rosuvastatin (CRESTOR) 5 MG tablet Take 1 tablet (5 mg total) by mouth at bedtime.  . [DISCONTINUED] Insulin Glargine, 2 Unit Dial, (TOUJEO MAX SOLOSTAR) 300 UNIT/ML SOPN Inject 10 Units into the skin daily.  . [DISCONTINUED] azithromycin (ZITHROMAX Z-PAK) 250 MG tablet Dispense 1 Z-Pak as directed (Patient not taking: Reported on 11/02/2019)  . [DISCONTINUED] azithromycin  (ZITHROMAX) 250 MG tablet Take as directed (Patient not taking: Reported on 11/02/2019)  . [DISCONTINUED] predniSONE (DELTASONE) 10 MG tablet Use per dose pack (Patient not taking: Reported on 11/02/2019)   No facility-administered encounter medications on file as of 11/02/2019.    Surgical History: Past Surgical History:  Procedure Laterality Date  . BREAST SURGERY  2010   left breast tumor removal    Medical History: Past Medical History:  Diagnosis Date  . Cancer [facility])    left breast s/p left lumpectomy  . Diabetes mellitus without complication ([place])   . Hypertension     Family History: History reviewed. No pertinent family history.  Social History   Socioeconomic History  . Marital status: Married    Spouse name: Not on file  . Number of children: Not on file  . Years of education: Not on file  . Highest education level: Not on file  Occupational History  . Not on file  Tobacco Use  . Smoking status: Former Smoker    Types: Cigarettes  . Smokeless tobacco: Never Used  Substance and Sexual Activity  . Alcohol use: Yes    Comment: ocassionally  . Drug use: No  . Sexual activity: Never  Other Topics Concern  . Not on file  Social History Narrative  . Not on file   Social Determinants of Health   Financial Resource Strain:   . Difficulty of Paying Living Expenses: Not on file  Food Insecurity:   . Worried About Charity fundraiser in the Last Year: Not on file  . Ran Out of Food in the Last Year: Not on file  Transportation Needs:   . Lack of Transportation (Medical): Not on file  . Lack of Transportation (Non-Medical): Not on file  Physical Activity:   . Days of Exercise per Week: Not on file  . Minutes of Exercise per Session: Not on file  Stress:   . Feeling of Stress : Not on file  Social Connections:   . Frequency of Communication with Friends and Family: Not on file  . Frequency of Social Gatherings with Friends and Family: Not on file  . Attends  Religious Services: Not on file  . Active Member of Clubs or Organizations: Not on file  . Attends Archivist Meetings: Not on file  . Marital Status: Not on file  Intimate Partner Violence:   . Fear of Current or Ex-Partner: Not on file  . Emotionally Abused: Not on file  . Physically Abused: Not on file  . Sexually Abused: Not on file      Review of Systems  Constitutional: Positive for fatigue. Negative for chills, fever and unexpected weight change.  HENT: Positive for congestion, postnasal drip, sinus pressure and sinus pain. Negative for rhinorrhea, sneezing and sore throat.   Eyes: Negative for itching.  Respiratory: Positive for cough. Negative for chest tightness, shortness of breath and wheezing.   Cardiovascular: Negative for chest pain and palpitations.  Gastrointestinal: Negative for abdominal pain, constipation, diarrhea, nausea and vomiting.  Endocrine: Negative for cold intolerance, heat intolerance, polydipsia and polyuria.       Blood sugars continue to be elevated.   Musculoskeletal: Negative for arthralgias, back pain, joint swelling and neck pain.       Recovering from broken arm.   Skin: Negative for rash.  Allergic/Immunologic: Negative for environmental allergies.  Neurological: Negative.  Negative for dizziness, tremors, numbness and headaches.  Hematological: Negative for adenopathy. Does not bruise/bleed easily.  Psychiatric/Behavioral: Negative for behavioral problems (Depression), sleep disturbance and suicidal ideas. The patient is not nervous/anxious.    Today's Vitals   11/02/19 1610  Temp: 98.6 F (37 C)  Weight: 138 lb (62.6 kg)  Height: 5\' 6"  (1.676 m)   Body mass index is 22.27 kg/m.  Observation/Objective:   The patient is alert and oriented. She is pleasant and answers all questions appropriately. Breathing is non-labored. She is in no acute distress at this time.  The patient is nasally congested and appears to feel poorly.     Assessment/Plan:  1. Pneumonia due to COVID-19 virus Patient doing a bit better after initial treatment with z-pack. Still congested with mild cough. Recommend she begin second z-pack. Take as directed. Use previously prescribed Flonase nasal spray. Rest and increase fluids.   2. Type 2 diabetes mellitus with hyperglycemia, with long-term current use of insulin (HCC) Gradually increaes toujeo from 10 to 15 units daily. conitnue victoza 0.6mg  daily. Check HgbA1c at next, in-office visit.  - Insulin Glargine, 2 Unit Dial, (TOUJEO MAX SOLOSTAR) 300 UNIT/ML SOPN; Inject 15 Units into the skin daily.  Dispense: 5 pen; Refill: 3  3. Essential (primary) hypertension [place]. Continue bp medication as prescribed   General Counseling: varetta browell understanding of the findings of today's phone visit and agrees with plan of treatment. I have discussed any further diagnostic evaluation that may be needed or ordered today. We also reviewed her medications today. she has been encouraged to call the office with any questions or concerns that should arise related to todays visit.  Diabetes Counseling:  1. Addition of ACE inh/ ARB'S for nephroprotection. Microalbumin is updated  2. Diabetic foot care, prevention of complications. Podiatry consult 3. Exercise and lose weight.  4. Diabetic eye examination, Diabetic eye exam is updated  5. Monitor blood sugar closlely. nutrition counseling.  6. Sign and symptoms of hypoglycemia including shaking sweating,confusion and headaches.  This patient was seen by [place] with Dr Lavera Guise as a part of collaborative care agreement  Meds ordered this encounter  Medications  . Insulin Glargine, 2 Unit Dial, (TOUJEO MAX SOLOSTAR) 300 UNIT/ML SOPN    Sig: Inject 15 Units into the skin daily.    Dispense:  5 pen    Refill:  3    Increased dose from 10 to 15 units    Order Specific Question:   Supervising Provider    Answer:   Lavera Guise [[id number]]    Time spent: 20 Minutes  Time spent with patient included reviewing progress notes, labs, imaging studies, and discussing plan for follow up.   Dr Lavera Guise Internal medicine

## 2019-11-02 NOTE — Progress Notes (Signed)
   Office Visit Note   Patient: Kristie Bowman           Date of Birth: Aug 19, 1961           MRN: [id number] Visit Date: 11/02/2019              Requested by: Ronnell Freshwater, NP [address] [place],  [place] [address] PCP: Ronnell Freshwater, NP   Assessment & Plan: Visit Diagnoses:  1. Closed displaced oblique fracture of shaft of right humerus, initial encounter     Plan: She will continue with physical therapy for strengthening and range of motion.  She had to briefly put PT on hold due to recent Covid.  She may discontinue the [place] at this point.  She may return back to work this coming Monday without restrictions.  Follow-up in 6 weeks for two-view x-rays of the right humerus.  Anticipate releasing her at that time.  Follow-Up Instructions: Return in about 6 weeks (around 12/14/2019).   Orders:  Orders Placed This Encounter  Procedures  . XR Humerus Right   No orders of the defined types were placed in this encounter.     Procedures: No procedures performed   Clinical Data: No additional findings.   Subjective: Chief Complaint  Patient presents with  . Right Shoulder - Pain, Follow-up    Kristie Bowman returns today for follow-up of her right proximal humerus shaft fracture.  She is doing well.  Reports no pain.   Review of Systems   Objective: Vital Signs: There were no vitals taken for this visit.  Physical Exam  Ortho Exam Right arm exam shows no pain with range of motion of the elbow or the shoulder.  No tenderness to palpation.  No swelling. Specialty Comments:  No specialty comments available.  Imaging: XR Humerus Right  Result Date: 11/02/2019 Stable alignment of humerus fracture callus formation and bony consolidation.    PMFS History: Patient Active Problem List   Diagnosis Date Noted  . Closed displaced oblique fracture of shaft of right humerus 08/31/2019  . Intractable vomiting 01/17/2019  . Acute intractable headache 01/17/2019  .  Uncontrolled type 2 diabetes mellitus with hyperglycemia ([place]) 07/18/2018  . Needs flu shot 07/18/2018  . Screening for colon cancer 07/18/2018  . Type 2 diabetes mellitus with mild nonproliferative diabetic retinopathy with macular edema, bilateral ([place]) 10/15/2017  . Essential (primary) hypertension 10/15/2017  . Patient's noncompliance with other medical treatment and regimen 10/15/2017  . Diarrhea, unspecified 10/15/2017  . Other fatigue 10/15/2017  . Type 2 diabetes mellitus with hyperglycemia ([place]) 10/15/2017  . Personal history of malignant neoplasm of breast 10/15/2017  . Low back pain 10/15/2017  . Hyperlipidemia 10/15/2017   Past Medical History:  Diagnosis Date  . Cancer [facility])    left breast s/p left lumpectomy  . Diabetes mellitus without complication ([place])   . Hypertension     No family history on file.  Past Surgical History:  Procedure Laterality Date  . BREAST SURGERY  2010   left breast tumor removal   Social History   Occupational History  . Not on file  Tobacco Use  . Smoking status: Former Smoker    Types: Cigarettes  . Smokeless tobacco: Never Used  Substance and Sexual Activity  . Alcohol use: Yes    Comment: ocassionally  . Drug use: No  . Sexual activity: Never

## 2019-11-07 ENCOUNTER — Telehealth: Payer: Self-pay | Admitting: Orthopaedic Surgery

## 2019-11-07 NOTE — Telephone Encounter (Signed)
Shay from [employer].  She needs the office visit notes from the patient's last visit.  Fax number: [phone]  Leave I.D # [phone]  Call back number: [phone]

## 2019-11-10 NOTE — Telephone Encounter (Signed)
FAXED

## 2019-12-15 ENCOUNTER — Ambulatory Visit: Payer: Managed Care, Other (non HMO) | Admitting: Orthopaedic Surgery

## 2019-12-19 ENCOUNTER — Ambulatory Visit: Payer: Managed Care, Other (non HMO) | Admitting: Orthopaedic Surgery

## 2019-12-29 ENCOUNTER — Telehealth: Payer: Self-pay

## 2019-12-29 NOTE — Telephone Encounter (Signed)
LMOM FOR PATIENT TO CONFIRM AND SCREEN FOR 01-02-20 OV.

## 2020-01-02 ENCOUNTER — Encounter: Payer: Managed Care, Other (non HMO) | Admitting: Nurse Practitioner

## 2020-04-19 ENCOUNTER — Other Ambulatory Visit: Payer: Self-pay

## 2020-04-19 DIAGNOSIS — Z794 Long term (current) use of insulin: Secondary | ICD-10-CM

## 2020-04-19 DIAGNOSIS — E1165 Type 2 diabetes mellitus with hyperglycemia: Secondary | ICD-10-CM

## 2020-04-19 MED ORDER — FIFTY50 PEN NEEDLES 32G X 4 MM MISC: 3 refills | Status: AC

## 2020-05-24 ENCOUNTER — Telehealth: Payer: Self-pay

## 2020-05-24 NOTE — Telephone Encounter (Signed)
Confirmed and screened for 05-28-20 ov. 

## 2020-05-28 ENCOUNTER — Ambulatory Visit (INDEPENDENT_AMBULATORY_CARE_PROVIDER_SITE_OTHER): Payer: Managed Care, Other (non HMO) | Admitting: Nurse Practitioner

## 2020-05-28 ENCOUNTER — Other Ambulatory Visit: Payer: Self-pay

## 2020-05-28 VITALS — BP 115/62 | HR 99 | Temp 97.5°F | Resp 16 | Ht 66.0 in | Wt 138.0 lb

## 2020-05-28 DIAGNOSIS — I10 Essential (primary) hypertension: Secondary | ICD-10-CM | POA: Diagnosis not present

## 2020-05-28 DIAGNOSIS — R197 Diarrhea, unspecified: Secondary | ICD-10-CM

## 2020-05-28 DIAGNOSIS — Z0001 Encounter for general adult medical examination with abnormal findings: Secondary | ICD-10-CM

## 2020-05-28 DIAGNOSIS — Z794 Long term (current) use of insulin: Secondary | ICD-10-CM

## 2020-05-28 DIAGNOSIS — Z9119 Patient's noncompliance with other medical treatment and regimen: Secondary | ICD-10-CM

## 2020-05-28 DIAGNOSIS — E1165 Type 2 diabetes mellitus with hyperglycemia: Secondary | ICD-10-CM | POA: Diagnosis not present

## 2020-05-28 DIAGNOSIS — Z91199 Patient's noncompliance with other medical treatment and regimen due to unspecified reason: Secondary | ICD-10-CM

## 2020-05-28 DIAGNOSIS — R300 Dysuria: Secondary | ICD-10-CM

## 2020-05-28 LAB — POCT GLYCOSYLATED HEMOGLOBIN (HGB A1C): Hemoglobin A1C: 11 % — AB (ref 4.0–5.6)

## 2020-05-28 MED ORDER — FREESTYLE LIBRE 14 DAY READER DEVI: 1.0000 [IU] | Freq: Every day | 5 refills | Status: DC

## 2020-05-28 MED ORDER — FREESTYLE LIBRE 14 DAY SENSOR MISC: 5 refills | Status: DC

## 2020-05-28 NOTE — Progress Notes (Signed)
[facility] [place], [place] [address]  Internal MEDICINE  Office Visit Note  Patient Name: Kristie Bowman  [id number]  [id number]  Date of Service: 06/12/2020   Pt is here for routine health maintenance examination   Chief Complaint  Patient presents with  . Annual Exam  . Diabetes  . Hypertension     The patient is here for health maintenance exam.  -severe elevation of blood sugar - HgbA1c 11.0. non-compliant with medication. Has only been taking victoza 0.6mg  daily. Not taking toujeo at all. Should be taking 10 units every day.  -frequent diarrhea. States that she will wake up in the middle of the night with watery stool. Will happen several times on nights when this happens. Happening several times each week.  -histlory of breat cancer. Last mammogram was 02/15/2020 and was benign   Current Medication: Outpatient Encounter Medications as of 05/28/2020  Medication Sig  . Accu-Chek FastClix Lancets MISC Use as directed  Twice day E11.65  . Continuous Blood Gluc Receiver (FREESTYLE LIBRE 14 DAY READER) DEVI 1 Units by Does not apply route daily.  Marland Kitchen glucose blood (ACCU-CHEK GUIDE) test strip Blood sugar testing TID - dx E11.65  . HYDROcodone-acetaminophen (NORCO) 5-325 MG tablet Take 1-2 tablets by mouth 3 (three) times daily as needed for moderate pain.  Marland Kitchen ibuprofen (ADVIL) 800 MG tablet Take 1 tablet (800 mg total) by mouth every 8 (eight) hours as needed.  . Insulin Glargine, 2 Unit Dial, (TOUJEO MAX SOLOSTAR) 300 UNIT/ML SOPN Inject 15 Units into the skin daily.  . Insulin Pen Needle (FIFTY50 PEN NEEDLES) 32G X 4 MM MISC Use with Toujeo and victoza injections. Will need 2 needles per day. E11.65  . liraglutide (VICTOZA) 18 MG/3ML SOPN Inject 0.3 mLs (1.8 mg total) into the skin daily. Please add to patient's med list.  . lisinopril (PRINIVIL,ZESTRIL) 5 MG tablet Take 1 tablet (5 mg total) by mouth daily. Please add on patient's med list  .  ondansetron (ZOFRAN ODT) 4 MG disintegrating tablet 4mg  ODT q4 hours prn nausea/vomit  . ondansetron (ZOFRAN) 4 MG tablet Take 1 tablet (4 mg total) by mouth every 8 (eight) hours as needed for nausea or vomiting.  Marland Kitchen oxyCODONE-acetaminophen (PERCOCET) 5-325 MG tablet Take 1 tablet by mouth every 6 (six) hours as needed.  . rosuvastatin (CRESTOR) 5 MG tablet Take 1 tablet (5 mg total) by mouth at bedtime.  . [DISCONTINUED] chlorpheniramine-HYDROcodone (TUSSIONEX PENNKINETIC ER) 10-8 MG/5ML SUER Take 5 mLs by mouth 2 (two) times daily.  . [DISCONTINUED] Continuous Blood Gluc Receiver (FREESTYLE LIBRE 14 DAY READER) DEVI 1 Units by Does not apply route daily.  . Continuous Blood Gluc Sensor (FREESTYLE LIBRE 14 DAY SENSOR) MISC Use as directed for continuous glucose monitoring   No facility-administered encounter medications on file as of 05/28/2020.    Surgical History: Past Surgical History:  Procedure Laterality Date  . BREAST SURGERY  2010   left breast tumor removal    Medical History: Past Medical History:  Diagnosis Date  . Cancer [facility])    left breast s/p left lumpectomy  . Diabetes mellitus without complication ([place])   . Hypertension     Family History: No family history on file.    Review of Systems  Constitutional: Positive for fatigue. Negative for chills, fever and unexpected weight change.       Continues to have weight loss.   HENT: Negative for congestion, postnasal drip, rhinorrhea, sinus pressure, sinus pain, sneezing  and sore throat.   Respiratory: Negative for cough, chest tightness, shortness of breath and wheezing.   Cardiovascular: Negative for chest pain and palpitations.  Gastrointestinal: Positive for nausea. Negative for abdominal pain, constipation, diarrhea and vomiting.  Endocrine: Negative for cold intolerance, heat intolerance, polydipsia and polyuria.       Blood sugars continue to be very elevated. Patient states that she is not taking her insulin  like she should.    Genitourinary: Negative for dysuria, hematuria and urgency.  Musculoskeletal: Negative for arthralgias, back pain, joint swelling and neck pain.  Skin: Negative for rash.  Allergic/Immunologic: Negative for environmental allergies.  Neurological: Positive for headaches. Negative for dizziness, tremors and numbness.  Hematological: Negative for adenopathy. Does not bruise/bleed easily.  Psychiatric/Behavioral: Negative for behavioral problems (Depression), sleep disturbance and suicidal ideas. The patient is nervous/anxious.      Today's Vitals   05/28/20 1542  BP: 115/62  Pulse: 99  Resp: 16  Temp: (!) 97.5 F (36.4 C)  SpO2: 99%  Weight: 138 lb (62.6 kg)  Height: 5\' 6"  (1.676 m)   Body mass index is 22.27 kg/m.  Physical Exam Vitals and nursing note reviewed.  Constitutional:      General: She is not in acute distress.    Appearance: Normal appearance. She is well-developed. She is not diaphoretic.  HENT:     Head: Normocephalic and atraumatic.     Nose: Nose normal.     Mouth/Throat:     Pharynx: No oropharyngeal exudate.  Eyes:     Pupils: Pupils are equal, round, and reactive to light.  Neck:     Thyroid: No thyromegaly.     Vascular: No JVD.     Trachea: No tracheal deviation.  Cardiovascular:     Rate and Rhythm: Normal rate and regular rhythm.     Pulses: Normal pulses.          Dorsalis pedis pulses are 2+ on the right side.       Posterior tibial pulses are 2+ on the right side and 2+ on the left side.     Heart sounds: Normal heart sounds. No murmur heard.  No friction rub. No gallop.   Pulmonary:     Effort: Pulmonary effort is normal. No respiratory distress.     Breath sounds: Normal breath sounds. No wheezing or rales.  Chest:     Chest wall: No tenderness.  Abdominal:     General: Bowel sounds are normal.     Palpations: Abdomen is soft.     Tenderness: There is no abdominal tenderness.  Musculoskeletal:        General:  Normal range of motion.     Cervical back: Normal range of motion and neck supple.     Right foot: Normal range of motion. No deformity or bunion.     Left foot: Normal range of motion. No deformity or bunion.  Feet:     Right foot:     Protective Sensation: 10 sites tested. 10 sites sensed.     Skin integrity: Skin integrity normal.     Toenail Condition: Right toenails are normal.     Left foot:     Protective Sensation: 10 sites tested. 10 sites sensed.     Skin integrity: Skin integrity normal.     Toenail Condition: Left toenails are normal.  Lymphadenopathy:     Cervical: No cervical adenopathy.  Skin:    General: Skin is warm and dry.  Neurological:  General: No focal deficit present.     Mental Status: She is alert and oriented to person, place, and time.     Cranial Nerves: No cranial nerve deficit.  Psychiatric:        Mood and Affect: Mood normal.        Behavior: Behavior normal.        Thought Content: Thought content normal.        Judgment: Judgment normal.      LABS: Recent Results (from the past 2160 hour(s))  POCT HgB A1C     Status: Abnormal   Collection Time: 05/28/20  4:03 PM  Result Value Ref Range   Hemoglobin A1C 11.0 (A) 4.0 - 5.6 %   HbA1c POC (<> result, manual entry)     HbA1c, POC (prediabetic range)     HbA1c, POC (controlled diabetic range)     Assessment/Plan: 1. Encounter for general adult medical examination with abnormal findings Annual wellness visit today. orde slip given to have routine, fasting labs checked.   2. Type 2 diabetes mellitus with hyperglycemia, with long-term current use of insulin (HCC) - POCT HgB A1C 11.0 today. Advised her to increase vicrtoza to 1.8mg  daily dose. Also advised her to restart use of basal insulin at 10 units daily. She should gradualy increase this dosing to 15 units daily. Monitor blood sugars closely. Ordered continuous glucose monitor to help with this.  - Continuous Blood Gluc Receiver  (FREESTYLE LIBRE 14 DAY READER) DEVI; 1 Units by Does not apply route daily.  Dispense: 2 each; Refill: 5 - Continuous Blood Gluc Sensor (FREESTYLE LIBRE 14 DAY SENSOR) MISC; Use as directed for continuous glucose monitoring  Dispense: 2 each; Refill: 5  3. Essential (primary) hypertension Stable. Continue bp medication as prescribed   4. Patient's noncompliance with other medical treatment and regimen Long discussion about importance of taking all medications as prescribed. Reviewed risk factors associated with untreated diabetes. She voiced understanding. States that she is going to try to do better.   5. Frequent diarrhea Concern for gallbladder issues. Will get ultrasound of abdomen for further evaluation.  - US Abdomen Complete; Future    General Counseling: milika ventress understanding of the findings of todays visit and agrees with plan of treatment. I have discussed any further diagnostic evaluation that may be needed or ordered today. We also reviewed her medications today. she has been encouraged to call the office with any questions or concerns that should arise related to todays visit.    Counseling:  Diabetes Counseling:  1. Addition of ACE inh/ ARB'S for nephroprotection. Microalbumin is updated  2. Diabetic foot care, prevention of complications. Podiatry consult 3. Exercise and lose weight.  4. Diabetic eye examination, Diabetic eye exam is updated  5. Monitor blood sugar closlely. nutrition counseling.  6. Sign and symptoms of hypoglycemia including shaking sweating,confusion and headaches.   Counseling: Adherence of Medical Therapy: The patient understands that it is the responsibility of the patient to complete all prescribed medications, all recommended testing, including but not limited to, laboratory studies and imaging. The patient further understands the need to keep all scheduled follow-up visits and to inform the office immediately of any changes in their  medical condition. The patient understands that the success of treatment in large part depends on the patient's willingness to complete the therapeutic regimen and to work in partnership with the designated health-care providers.  This patient was seen by Leretha Pol FNP Collaboration with Dr Lavera Guise as  a part of collaborative care agreement   Orders Placed This Encounter  Procedures  . US Abdomen Complete  . POCT HgB A1C    Meds ordered this encounter  Medications  . Continuous Blood Gluc Receiver (FREESTYLE LIBRE 14 DAY READER) DEVI    Sig: 1 Units by Does not apply route daily.    Dispense:  2 each    Refill:  5    Please fill with dexcom monitoring system if insurance prefers.    Order Specific Question:   Supervising Provider    Answer:   Lavera Guise [[id number]]  . Continuous Blood Gluc Sensor (FREESTYLE LIBRE 14 DAY SENSOR) MISC    Sig: Use as directed for continuous glucose monitoring    Dispense:  2 each    Refill:  5    Please fill with dexcom monitoring system if insurance prefers.    Order Specific Question:   Supervising Provider    Answer:   Lavera Guise [[id number]]    Total time spent: 33 Minutes  Time spent includes review of chart, medications, test results, and follow up plan with the patient.     Lavera Guise, MD  Internal Medicine

## 2020-06-12 ENCOUNTER — Encounter: Payer: Self-pay | Admitting: Nurse Practitioner

## 2020-06-12 DIAGNOSIS — Z0001 Encounter for general adult medical examination with abnormal findings: Secondary | ICD-10-CM | POA: Insufficient documentation

## 2020-06-12 DIAGNOSIS — R300 Dysuria: Secondary | ICD-10-CM | POA: Insufficient documentation

## 2020-07-05 ENCOUNTER — Other Ambulatory Visit: Payer: Self-pay | Admitting: Nurse Practitioner

## 2020-07-06 LAB — COMPREHENSIVE METABOLIC PANEL
ALT: 14 IU/L (ref 0–32)
AST: 12 IU/L (ref 0–40)
Albumin/Globulin Ratio: 1.5 (ref 1.2–2.2)
Albumin: 4 g/dL (ref 3.8–4.9)
Alkaline Phosphatase: 80 IU/L (ref 44–121)
BUN/Creatinine Ratio: 17 (ref 9–23)
BUN: 11 mg/dL (ref 6–24)
Bilirubin Total: 0.6 mg/dL (ref 0.0–1.2)
CO2: 21 mmol/L (ref 20–29)
Calcium: 9 mg/dL (ref 8.7–10.2)
Chloride: 104 mmol/L (ref 96–106)
Creatinine, Ser: 0.64 mg/dL (ref 0.57–1.00)
GFR calc Af Amer: 114 mL/min/{1.73_m2} (ref >59)
GFR calc non Af Amer: 99 mL/min/{1.73_m2} (ref >59)
Globulin, Total: 2.7 g/dL (ref 1.5–4.5)
Glucose: 236 mg/dL — ABNORMAL HIGH (ref 65–99)
Potassium: 4 mmol/L (ref 3.5–5.2)
Sodium: 139 mmol/L (ref 134–144)
Total Protein: 6.7 g/dL (ref 6.0–8.5)

## 2020-07-06 LAB — LIPASE: Lipase: 27 U/L (ref 14–72)

## 2020-07-06 LAB — VITAMIN D 25 HYDROXY (VIT D DEFICIENCY, FRACTURES): Vit D, 25-Hydroxy: 17.3 ng/mL — ABNORMAL LOW (ref 30.0–100.0)

## 2020-07-06 LAB — LIPID PANEL WITH LDL/HDL RATIO
Cholesterol, Total: 251 mg/dL — ABNORMAL HIGH (ref 100–199)
HDL: 29 mg/dL — ABNORMAL LOW (ref >39)
LDL Chol Calc (NIH): 152 mg/dL — ABNORMAL HIGH (ref 0–99)
LDL/HDL Ratio: 5.2 ratio — ABNORMAL HIGH (ref 0.0–3.2)
Triglycerides: 374 mg/dL — ABNORMAL HIGH (ref 0–149)
VLDL Cholesterol Cal: 70 mg/dL — ABNORMAL HIGH (ref 5–40)

## 2020-07-06 LAB — AMYLASE: Amylase: 26 U/L — ABNORMAL LOW (ref 31–110)

## 2020-07-06 LAB — CBC
Hematocrit: 36.4 % (ref 34.0–46.6)
Hemoglobin: 13 g/dL (ref 11.1–15.9)
MCH: 29.8 pg (ref 26.6–33.0)
MCHC: 35.7 g/dL (ref 31.5–35.7)
MCV: 84 fL (ref 79–97)
Platelets: 238 10*3/uL (ref 150–450)
RBC: 4.36 x10E6/uL (ref 3.77–5.28)
RDW: 12.2 % (ref 11.7–15.4)
WBC: 5.2 10*3/uL (ref 3.4–10.8)

## 2020-07-06 LAB — TSH: TSH: 2.48 u[IU]/mL (ref 0.450–4.500)

## 2020-07-06 LAB — HCV INTERPRETATION

## 2020-07-06 LAB — C-PEPTIDE: C-Peptide: 5 ng/mL — ABNORMAL HIGH (ref 1.1–4.4)

## 2020-07-06 LAB — HCV AB W REFLEX TO QUANT PCR: HCV Ab: <0.1 s/co ratio (ref 0.0–0.9)

## 2020-07-06 LAB — T4, FREE: Free T4: 1.19 ng/dL (ref 0.82–1.77)

## 2020-07-10 ENCOUNTER — Telehealth: Payer: Self-pay

## 2020-07-10 NOTE — Telephone Encounter (Signed)
UNABLE TO LMOM TO CONFIRM [place] FOR 07/12/20.

## 2020-07-12 ENCOUNTER — Ambulatory Visit (INDEPENDENT_AMBULATORY_CARE_PROVIDER_SITE_OTHER): Payer: Managed Care, Other (non HMO)

## 2020-07-12 ENCOUNTER — Other Ambulatory Visit: Payer: Self-pay

## 2020-07-12 DIAGNOSIS — R197 Diarrhea, unspecified: Secondary | ICD-10-CM

## 2020-07-17 NOTE — Progress Notes (Signed)
Normal ultrasound. Review at visit 07/22/2020

## 2020-07-17 NOTE — Progress Notes (Signed)
Non-compliant with medication. Very high lipid panel. Low vitamin d. Discuss at visit 10/4

## 2020-07-22 ENCOUNTER — Encounter: Payer: Self-pay | Admitting: Nurse Practitioner

## 2020-07-22 ENCOUNTER — Other Ambulatory Visit: Payer: Self-pay

## 2020-07-22 ENCOUNTER — Ambulatory Visit (INDEPENDENT_AMBULATORY_CARE_PROVIDER_SITE_OTHER): Payer: Managed Care, Other (non HMO) | Admitting: Hospice and Palliative Medicine

## 2020-07-22 DIAGNOSIS — L739 Follicular disorder, unspecified: Secondary | ICD-10-CM | POA: Diagnosis not present

## 2020-07-22 DIAGNOSIS — E785 Hyperlipidemia, unspecified: Secondary | ICD-10-CM

## 2020-07-22 DIAGNOSIS — E1165 Type 2 diabetes mellitus with hyperglycemia: Secondary | ICD-10-CM | POA: Diagnosis not present

## 2020-07-22 DIAGNOSIS — I10 Essential (primary) hypertension: Secondary | ICD-10-CM | POA: Diagnosis not present

## 2020-07-22 MED ORDER — ROSUVASTATIN CALCIUM 5 MG PO TABS: 5.0000 mg | ORAL_TABLET | Freq: Every day | ORAL | 1 refills | Status: DC

## 2020-07-22 MED ORDER — DOXYCYCLINE HYCLATE 100 MG PO TABS: 100.0000 mg | ORAL_TABLET | Freq: Two times a day (BID) | ORAL | 0 refills | Status: DC

## 2020-07-22 MED ORDER — GLIMEPIRIDE 2 MG PO TABS: 2.0000 mg | ORAL_TABLET | Freq: Every day | ORAL | 3 refills | Status: DC

## 2020-07-22 NOTE — Progress Notes (Signed)
[facility] [place], [place] [address]  Internal MEDICINE  Office Visit Note  Patient Name: Kristie Bowman  [id number]  [id number]  Date of Service: 07/23/2020  Chief Complaint  Patient presents with  . Follow-up  . Diabetes  . Hypertension  . Quality Metric Gaps    flu, covid    HPI  Patient is here for routine follow-up  Since her last visit, when A1C was found to be 11.0 she has been adhering to her medication therapy, she is currently taking 18 units of Toujeo as well a 1.8 mg of Victoza She has been checking her glucose levels at home and has noticed a significant improvement, ranging 250-280 in the mornings We reviewed her labs--impaired fasting lipid levels, low vitamin D, elevated C-peptide levels  Has not been taking her Crestor for several months  She complains today of having a boil recently show up on the right side of her face on the cheek area--has been there for a little over a week, has been applying a warm compress to the area, has improved some but still tender, red and swollen She says she gets boils on various parts of her body every couple of months   Current Medication: Outpatient Encounter Medications as of 07/22/2020  Medication Sig  . Accu-Chek FastClix Lancets MISC Use as directed  Twice day E11.65  . Continuous Blood Gluc Receiver (FREESTYLE LIBRE 14 DAY READER) DEVI 1 Units by Does not apply route daily.  . Continuous Blood Gluc Sensor (FREESTYLE LIBRE 14 DAY SENSOR) MISC Use as directed for continuous glucose monitoring  . glucose blood (ACCU-CHEK GUIDE) test strip Blood sugar testing TID - dx E11.65  . ibuprofen (ADVIL) 800 MG tablet Take 1 tablet (800 mg total) by mouth every 8 (eight) hours as needed.  . Insulin Glargine, 2 Unit Dial, (TOUJEO MAX SOLOSTAR) 300 UNIT/ML SOPN Inject 15 Units into the skin daily.  . Insulin Pen Needle (FIFTY50 PEN NEEDLES) 32G X 4 MM MISC Use with Toujeo and victoza injections. Will need 2  needles per day. E11.65  . liraglutide (VICTOZA) 18 MG/3ML SOPN Inject 0.3 mLs (1.8 mg total) into the skin daily. Please add to patient's med list.  . lisinopril (PRINIVIL,ZESTRIL) 5 MG tablet Take 1 tablet (5 mg total) by mouth daily. Please add on patient's med list  . oxyCODONE-acetaminophen (PERCOCET) 5-325 MG tablet Take 1 tablet by mouth every 6 (six) hours as needed.  . rosuvastatin (CRESTOR) 5 MG tablet Take 1 tablet (5 mg total) by mouth at bedtime.  . [DISCONTINUED] HYDROcodone-acetaminophen (NORCO) 5-325 MG tablet Take 1-2 tablets by mouth 3 (three) times daily as needed for moderate pain.  . [DISCONTINUED] ondansetron (ZOFRAN ODT) 4 MG disintegrating tablet 4mg  ODT q4 hours prn nausea/vomit  . [DISCONTINUED] ondansetron (ZOFRAN) 4 MG tablet Take 1 tablet (4 mg total) by mouth every 8 (eight) hours as needed for nausea or vomiting.  . [DISCONTINUED] rosuvastatin (CRESTOR) 5 MG tablet Take 1 tablet (5 mg total) by mouth at bedtime.  Marland Kitchen doxycycline (VIBRA-TABS) 100 MG tablet Take 1 tablet (100 mg total) by mouth 2 (two) times daily.  Marland Kitchen glimepiride (AMARYL) 2 MG tablet Take 1 tablet (2 mg total) by mouth daily before breakfast.   No facility-administered encounter medications on file as of 07/22/2020.    Surgical History: Past Surgical History:  Procedure Laterality Date  . BREAST SURGERY  2010   left breast tumor removal    Medical History: Past Medical History:  Diagnosis Date  . Cancer [facility])    left breast s/p left lumpectomy  . Diabetes mellitus without complication ([place])   . Hypertension     Family History: History reviewed. No pertinent family history.  Social History   Socioeconomic History  . Marital status: Married    Spouse name: Not on file  . Number of children: Not on file  . Years of education: Not on file  . Highest education level: Not on file  Occupational History  . Not on file  Tobacco Use  . Smoking status: Former Smoker    Types: Cigarettes   . Smokeless tobacco: Never Used  Vaping Use  . Vaping Use: Never used  Substance and Sexual Activity  . Alcohol use: Yes    Comment: ocassionally  . Drug use: No  . Sexual activity: Never  Other Topics Concern  . Not on file  Social History Narrative  . Not on file   Social Determinants of Health   Financial Resource Strain:   . Difficulty of Paying Living Expenses: Not on file  Food Insecurity:   . Worried About Charity fundraiser in the Last Year: Not on file  . Ran Out of Food in the Last Year: Not on file  Transportation Needs:   . Lack of Transportation (Medical): Not on file  . Lack of Transportation (Non-Medical): Not on file  Physical Activity:   . Days of Exercise per Week: Not on file  . Minutes of Exercise per Session: Not on file  Stress:   . Feeling of Stress : Not on file  Social Connections:   . Frequency of Communication with Friends and Family: Not on file  . Frequency of Social Gatherings with Friends and Family: Not on file  . Attends Religious Services: Not on file  . Active Member of Clubs or Organizations: Not on file  . Attends Archivist Meetings: Not on file  . Marital Status: Not on file  Intimate Partner Violence:   . Fear of Current or Ex-Partner: Not on file  . Emotionally Abused: Not on file  . Physically Abused: Not on file  . Sexually Abused: Not on file    Review of Systems  Constitutional: Negative for chills, diaphoresis and fatigue.  HENT: Negative for ear pain, postnasal drip and sinus pressure.   Eyes: Negative for photophobia, discharge, redness, itching and visual disturbance.  Respiratory: Negative for cough, shortness of breath and wheezing.   Cardiovascular: Negative for chest pain, palpitations and leg swelling.  Gastrointestinal: Negative for abdominal pain, constipation, diarrhea, nausea and vomiting.  Genitourinary: Negative for dysuria and flank pain.  Musculoskeletal: Negative for arthralgias, back pain,  gait problem and neck pain.  Skin: Negative for color change.       Boil to right side of her face, on cheek  Allergic/Immunologic: Negative for environmental allergies and food allergies.  Neurological: Negative for dizziness and headaches.  Hematological: Does not bruise/bleed easily.  Psychiatric/Behavioral: Negative for agitation, behavioral problems (depression) and hallucinations.    Vital Signs: BP (!) 119/97   Pulse 99   Temp (!) 97.5 F (36.4 C)   Resp 16   Ht 5\' 6"  (1.676 m)   Wt 142 lb 9.6 oz (64.7 kg)   SpO2 99%   BMI 23.02 kg/m    Physical Exam Vitals reviewed.  Constitutional:      Appearance: Normal appearance. She is normal weight.  HENT:     Mouth/Throat:     Mouth:  Mucous membranes are moist.     Pharynx: Oropharynx is clear.  Cardiovascular:     Rate and Rhythm: Normal rate and regular rhythm.     Pulses: Normal pulses.     Heart sounds: Normal heart sounds.  Pulmonary:     Effort: Pulmonary effort is normal.     Breath sounds: Normal breath sounds.  Abdominal:     General: Abdomen is flat.     Palpations: Abdomen is soft.  Musculoskeletal:        General: Normal range of motion.     Cervical back: Normal range of motion.  Skin:    General: Skin is warm.     Comments: Inflamed, erythematous boil to right side of face on lower cheek   Neurological:     General: No focal deficit present.     Mental Status: She is alert and oriented to person, place, and time. Mental status is at baseline.  Psychiatric:        Mood and Affect: Mood normal.        Behavior: Behavior normal.        Thought Content: Thought content normal.     Assessment/Plan: 1. Hyperlipidemia, unspecified hyperlipidemia type Advised the importance of medication adherence. Discussed the risks associated with untreated hyperlipidemia such as stroke and heart attack. Requesting refills today as she plans to start back taking all of her medications as prescribed. - rosuvastatin  (CRESTOR) 5 MG tablet; Take 1 tablet (5 mg total) by mouth at bedtime.  Dispense: 90 tablet; Refill: 1  2. Uncontrolled type 2 diabetes mellitus with hyperglycemia ([place]) Since last visit she has been taking her Toujeo as well as Victoza as prescribed, she has noticed an improvement in her glucose levels when she checks them at home Will add glimepiride to further manage and control her glucose levels, will recheck A1C in November and adjust therapy as needed - glimepiride (AMARYL) 2 MG tablet; Take 1 tablet (2 mg total) by mouth daily before breakfast.  Dispense: 30 tablet; Refill: 3  3. Essential (primary) hypertension Blood pressure and heart rate well controlled today, will continue with routine monitoring  4. Acute folliculitis Will treat with doxycycline and assess response to therapy May need preventative treatment if this continues to be a recurrent issue - doxycycline (VIBRA-TABS) 100 MG tablet; Take 1 tablet (100 mg total) by mouth 2 (two) times daily.  Dispense: 14 tablet; Refill: 0  General Counseling: Britney verbalizes understanding of the findings of todays visit and agrees with plan of treatment. I have discussed any further diagnostic evaluation that may be needed or ordered today. We also reviewed her medications today. she has been encouraged to call the office with any questions or concerns that should arise related to todays visit.  Meds ordered this encounter  Medications  . glimepiride (AMARYL) 2 MG tablet    Sig: Take 1 tablet (2 mg total) by mouth daily before breakfast.    Dispense:  30 tablet    Refill:  3  . rosuvastatin (CRESTOR) 5 MG tablet    Sig: Take 1 tablet (5 mg total) by mouth at bedtime.    Dispense:  90 tablet    Refill:  1    Please add to patient's med list  . doxycycline (VIBRA-TABS) 100 MG tablet    Sig: Take 1 tablet (100 mg total) by mouth 2 (two) times daily.    Dispense:  14 tablet    Refill:  0    Time  spent: 30 Minutes Time spent  includes review of chart, medications, test results and follow-up plan with the patient.  This patient was seen by Theodoro Grist AGNP-C in Collaboration with Dr Lavera Guise as a part of collaborative care agreement     Tanna Furry. [place]ford Staheli AGNP-C Internal medicine

## 2020-07-23 ENCOUNTER — Encounter: Payer: Self-pay | Admitting: Hospice and Palliative Medicine

## 2020-08-20 ENCOUNTER — Ambulatory Visit: Payer: Managed Care, Other (non HMO) | Admitting: Nurse Practitioner

## 2020-08-26 ENCOUNTER — Encounter: Payer: Self-pay | Admitting: Internal Medicine

## 2020-08-26 ENCOUNTER — Ambulatory Visit (INDEPENDENT_AMBULATORY_CARE_PROVIDER_SITE_OTHER): Payer: Managed Care, Other (non HMO) | Admitting: Internal Medicine

## 2020-08-26 ENCOUNTER — Ambulatory Visit: Payer: Managed Care, Other (non HMO) | Admitting: Internal Medicine

## 2020-08-26 VITALS — Resp 16 | Ht 66.0 in | Wt 138.0 lb

## 2020-08-26 DIAGNOSIS — J0100 Acute maxillary sinusitis, unspecified: Secondary | ICD-10-CM

## 2020-08-26 DIAGNOSIS — E1165 Type 2 diabetes mellitus with hyperglycemia: Secondary | ICD-10-CM | POA: Diagnosis not present

## 2020-08-26 MED ORDER — AMOXICILLIN-POT CLAVULANATE 875-125 MG PO TABS: 1.0000 | ORAL_TABLET | Freq: Two times a day (BID) | ORAL | 0 refills | Status: DC

## 2020-08-26 NOTE — Progress Notes (Signed)
[facility] [place], [place] [address]  Internal MEDICINE  Telephone Visit  Patient Name: Kristie Bowman  [id number]  [id number]  Date of Service: 08/29/2020  I connected with the patient at 1233 by telephone and verified the patients identity using two identifiers.   I discussed the limitations, risks, security and privacy concerns of performing an evaluation and management service by telephone and the availability of in person appointments. I also discussed with the patient that there may be a patient responsible charge related to the service.  The patient expressed understanding and agrees to proceed.    Chief Complaint  Patient presents with  . Acute Visit    jaw hurts, throat sore, dranage, since thursday, also needs new machine for diabetes, it was not approved by insurance  . Telephone Assessment    [phone]   . Telephone Screen    video call or phone  . Sinusitis  . Ear Pain    teeth pain  . Hypertension  . Diabetes  . Quality Metric Gaps    pap, pneumovax    HPI Pt is c/o sinus congestion and sore throat for the last 4 days. Denies any fever or chills. Pt is a diabetic as well, diabetes is not under good control  Current Medication: Outpatient Encounter Medications as of 08/26/2020  Medication Sig  . Accu-Chek FastClix Lancets MISC Use as directed  Twice day E11.65  . Continuous Blood Gluc Receiver (FREESTYLE LIBRE 14 DAY READER) DEVI 1 Units by Does not apply route daily.  . Continuous Blood Gluc Sensor (FREESTYLE LIBRE 14 DAY SENSOR) MISC Use as directed for continuous glucose monitoring  . glimepiride (AMARYL) 2 MG tablet Take 1 tablet (2 mg total) by mouth daily before breakfast.  . glucose blood (ACCU-CHEK GUIDE) test strip Blood sugar testing TID - dx E11.65  . ibuprofen (ADVIL) 800 MG tablet Take 1 tablet (800 mg total) by mouth every 8 (eight) hours as needed.  . Insulin Glargine, 2 Unit Dial, (TOUJEO MAX SOLOSTAR) 300 UNIT/ML  SOPN Inject 15 Units into the skin daily.  . Insulin Pen Needle (FIFTY50 PEN NEEDLES) 32G X 4 MM MISC Use with Toujeo and victoza injections. Will need 2 needles per day. E11.65  . liraglutide (VICTOZA) 18 MG/3ML SOPN Inject 0.3 mLs (1.8 mg total) into the skin daily. Please add to patient's med list.  . lisinopril (PRINIVIL,ZESTRIL) 5 MG tablet Take 1 tablet (5 mg total) by mouth daily. Please add on patient's med list  . rosuvastatin (CRESTOR) 5 MG tablet Take 1 tablet (5 mg total) by mouth at bedtime.  Marland Kitchen amoxicillin-clavulanate (AUGMENTIN) 875-125 MG tablet Take 1 tablet by mouth 2 (two) times daily.  . [DISCONTINUED] doxycycline (VIBRA-TABS) 100 MG tablet Take 1 tablet (100 mg total) by mouth 2 (two) times daily.  . [DISCONTINUED] oxyCODONE-acetaminophen (PERCOCET) 5-325 MG tablet Take 1 tablet by mouth every 6 (six) hours as needed.   No facility-administered encounter medications on file as of 08/26/2020.    Surgical History: Past Surgical History:  Procedure Laterality Date  . BREAST SURGERY  2010   left breast tumor removal    Medical History: Past Medical History:  Diagnosis Date  . Cancer [facility])    left breast s/p left lumpectomy  . Diabetes mellitus without complication ([place])   . Hypertension     Family History: Family History  Problem Relation Age of Onset  . Hyperlipidemia Mother   . Diabetes Maternal Grandmother     Social  History   Socioeconomic History  . Marital status: Married    Spouse name: Not on file  . Number of children: Not on file  . Years of education: Not on file  . Highest education level: Not on file  Occupational History  . Not on file  Tobacco Use  . Smoking status: Former Smoker    Types: Cigarettes  . Smokeless tobacco: Never Used  Vaping Use  . Vaping Use: Never used  Substance and Sexual Activity  . Alcohol use: Yes    Comment: ocassionally  . Drug use: No  . Sexual activity: Never  Other Topics Concern  . Not on file  Social  History Narrative  . Not on file   Social Determinants of Health   Financial Resource Strain:   . Difficulty of Paying Living Expenses: Not on file  Food Insecurity:   . Worried About Charity fundraiser in the Last Year: Not on file  . Ran Out of Food in the Last Year: Not on file  Transportation Needs:   . Lack of Transportation (Medical): Not on file  . Lack of Transportation (Non-Medical): Not on file  Physical Activity:   . Days of Exercise per Week: Not on file  . Minutes of Exercise per Session: Not on file  Stress:   . Feeling of Stress : Not on file  Social Connections:   . Frequency of Communication with Friends and Family: Not on file  . Frequency of Social Gatherings with Friends and Family: Not on file  . Attends Religious Services: Not on file  . Active Member of Clubs or Organizations: Not on file  . Attends Archivist Meetings: Not on file  . Marital Status: Not on file  Intimate Partner Violence:   . Fear of Current or Ex-Partner: Not on file  . Emotionally Abused: Not on file  . Physically Abused: Not on file  . Sexually Abused: Not on file      Review of Systems  Constitutional: Positive for chills. Negative for diaphoresis.  HENT: Positive for postnasal drip, sinus pressure, sinus pain and tinnitus. Negative for ear pain.   Respiratory: Negative.  Negative for cough, shortness of breath and wheezing.   Cardiovascular: Negative.  Negative for chest pain, palpitations and leg swelling.  Gastrointestinal: Negative.  Negative for diarrhea, nausea and vomiting.  Genitourinary: Negative for dysuria and flank pain.  Skin: Negative for color change.  Allergic/Immunologic: Positive for environmental allergies and food allergies.  Neurological: Negative for dizziness and headaches.  Hematological: Does not bruise/bleed easily.  Psychiatric/Behavioral: Negative for agitation, behavioral problems (depression) and hallucinations.    Vital Signs: Resp  16   Ht 5\' 6"  (1.676 m)   Wt 138 lb (62.6 kg)   BMI 22.27 kg/m    Observation/Objective:  Pt is congested and appears ill  Assessment/Plan: 1. Acute non-recurrent maxillary sinusitis Take all meds as prescribd today. Get Flonase OTC, use as directed  - amoxicillin-clavulanate (AUGMENTIN) 875-125 MG tablet; Take 1 tablet by mouth 2 (two) times daily.  Dispense: 20 tablet; Refill: 0  2. Uncontrolled type 2 diabetes mellitus with hyperglycemia (HCC) Uncontrolled dm, pt will need in office visit   General Counseling: Kristie Bowman understanding of the findings of today's phone visit and agrees with plan of treatment. I have discussed any further diagnostic evaluation that may be needed or ordered today. We also reviewed her medications today. she has been encouraged to call the office with any questions or concerns  that should arise related to todays visit.    No orders of the defined types were placed in this encounter.   Meds ordered this encounter  Medications  . amoxicillin-clavulanate (AUGMENTIN) 875-125 MG tablet    Sig: Take 1 tablet by mouth 2 (two) times daily.    Dispense:  20 tablet    Refill:  0    Time spent:15 Minutes    Dr Lavera Guise Internal medicine

## 2020-09-14 IMAGING — CR DG HUMERUS 2V *R*
2 series · 2 of 2 positions shown · non-contrast
Comparison: None.

CLINICAL DATA: Fall, pain

EXAM:
RIGHT SHOULDER - 2+ VIEW; RIGHT HUMERUS - 2+ VIEW

[w humerus ap right]
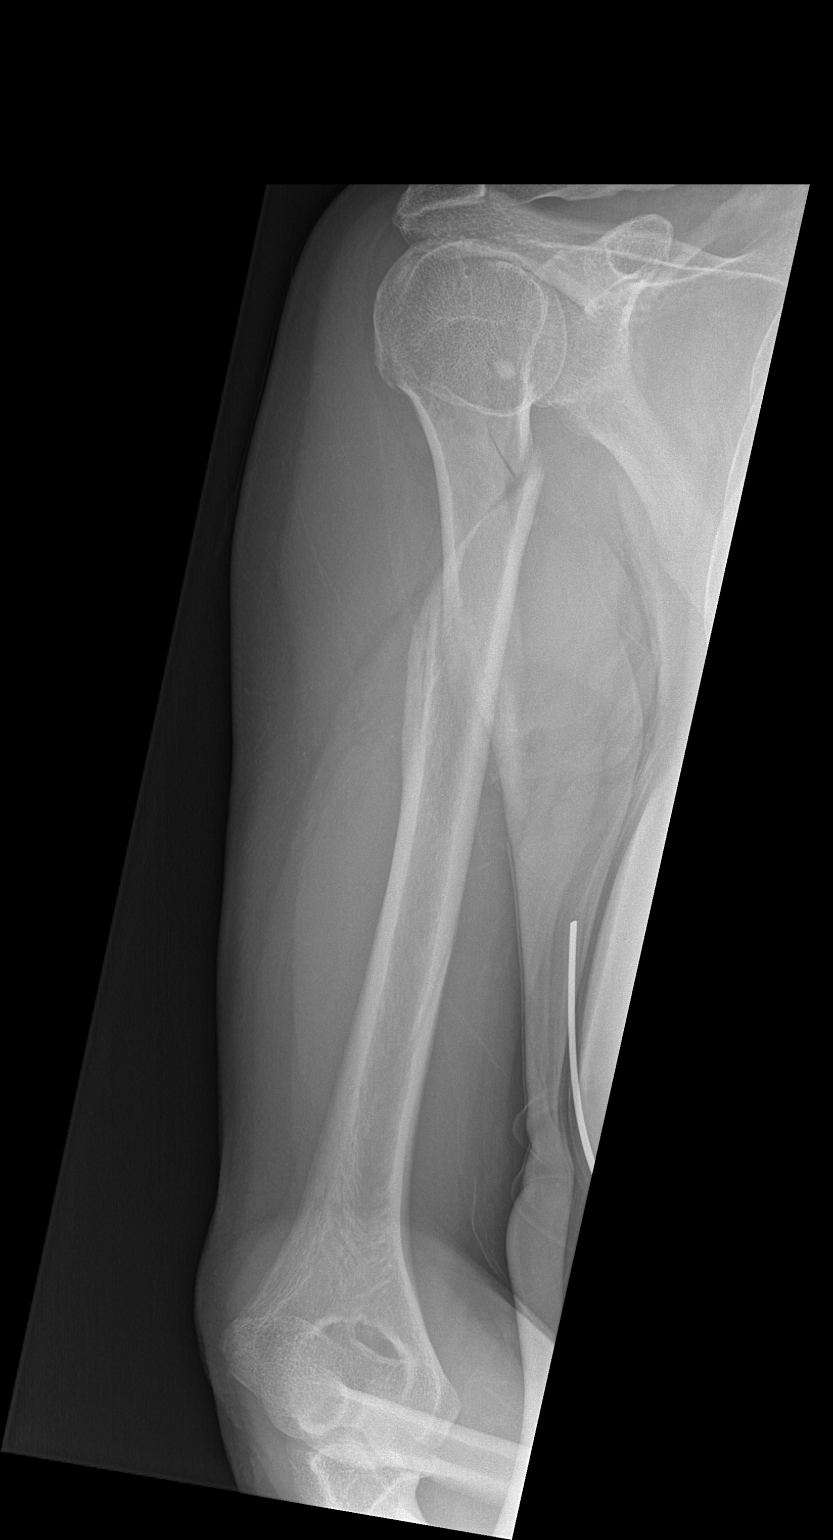

[w humerus lat right]
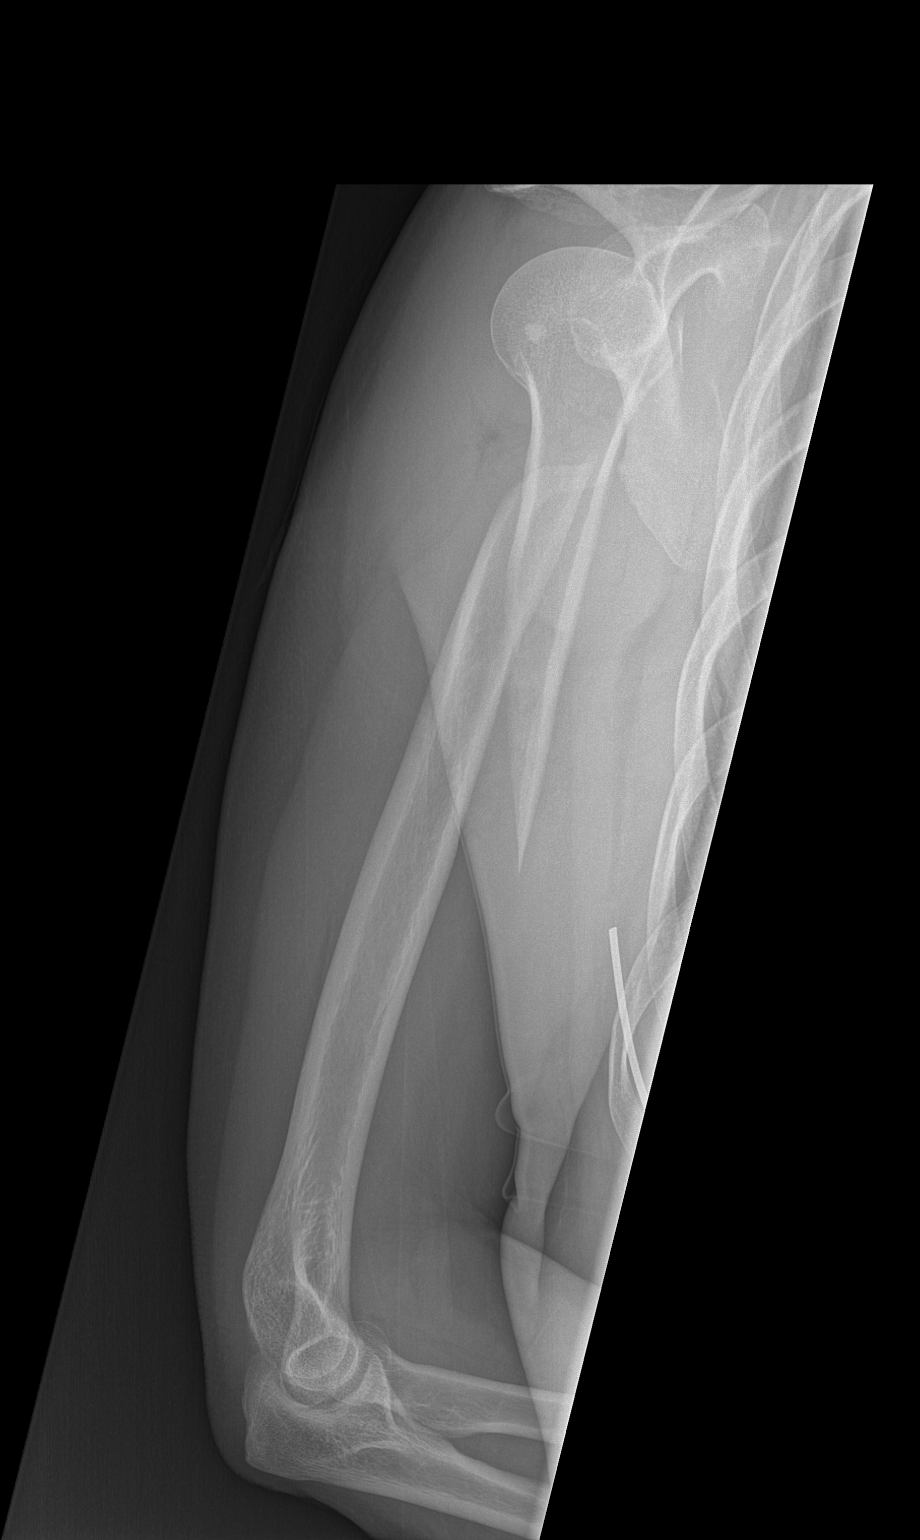

[2 of 2 positions shown; findings below may reference images not displayed]

FINDINGS: No fracture or dislocation of the right shoulder proper. The
glenohumeral and acromioclavicular joints are preserved and in
anatomic apposition. The partially imaged right chest is
unremarkable.

There is a mildly displaced and angulated spiral type fracture of
the proximal right humeral diaphysis. No fracture or dislocation of
the distal right radius.
IMPRESSION: 1. There is a mildly displaced and angulated spiral type fracture of
the proximal right humeral diaphysis.

2. No fracture or dislocation of the right shoulder proper. The
glenohumeral and acromioclavicular joints are preserved and in
anatomic apposition.

## 2020-09-23 ENCOUNTER — Ambulatory Visit: Payer: Managed Care, Other (non HMO) | Admitting: Nurse Practitioner

## 2020-09-25 ENCOUNTER — Ambulatory Visit (INDEPENDENT_AMBULATORY_CARE_PROVIDER_SITE_OTHER): Payer: Managed Care, Other (non HMO) | Admitting: Nurse Practitioner

## 2020-09-25 ENCOUNTER — Other Ambulatory Visit: Payer: Self-pay

## 2020-09-25 ENCOUNTER — Encounter: Payer: Self-pay | Admitting: Nurse Practitioner

## 2020-09-25 VITALS — BP 106/86 | HR 88 | Temp 97.1°F | Resp 16 | Ht 66.0 in | Wt 146.8 lb

## 2020-09-25 DIAGNOSIS — E1165 Type 2 diabetes mellitus with hyperglycemia: Secondary | ICD-10-CM

## 2020-09-25 DIAGNOSIS — R197 Diarrhea, unspecified: Secondary | ICD-10-CM

## 2020-09-25 DIAGNOSIS — Z794 Long term (current) use of insulin: Secondary | ICD-10-CM

## 2020-09-25 DIAGNOSIS — I10 Essential (primary) hypertension: Secondary | ICD-10-CM

## 2020-09-25 DIAGNOSIS — E785 Hyperlipidemia, unspecified: Secondary | ICD-10-CM

## 2020-09-25 DIAGNOSIS — E559 Vitamin D deficiency, unspecified: Secondary | ICD-10-CM

## 2020-09-25 LAB — POCT GLYCOSYLATED HEMOGLOBIN (HGB A1C): Hemoglobin A1C: 10 % — AB (ref 4.0–5.6)

## 2020-09-25 MED ORDER — ROSUVASTATIN CALCIUM 10 MG PO TABS: 10.0000 mg | ORAL_TABLET | Freq: Every day | ORAL | 1 refills | Status: DC

## 2020-09-25 MED ORDER — TOUJEO MAX SOLOSTAR 300 UNIT/ML ~~LOC~~ SOPN: PEN_INJECTOR | SUBCUTANEOUS | 1 refills | Status: DC

## 2020-09-25 MED ORDER — ERGOCALCIFEROL 1.25 MG (50000 UT) PO CAPS: 50000.0000 [IU] | ORAL_CAPSULE | ORAL | 5 refills | Status: DC

## 2020-09-25 NOTE — Progress Notes (Signed)
[facility] [place], [place] [address]  Internal MEDICINE  Office Visit Note  Patient Name: Kristie Bowman  [id number]  [id number]  Date of Service: 10/26/2020  Chief Complaint  Patient presents with  . Follow-up  . Hypertension  . Diabetes  . policy update form    received    The patient is here for follow up visit.  -blood sugars still very high, though improving since her last visit. HgbA1c 10.0, down from 11.0 at her last visit. Currently taking Victoza 0.6mg  daily and only taking 12 units of Toujeo. Afraid to go higher due to possibility of hypoglycemia.  -labs indicate moderate elevation of lipid panel with HDL/LDL ratio of 5.2. She is currently taking rosuvastatin 5mg  daily.  -low vitamin d -abdominal ultrasound indicated left renal cyst without any acute abnormalities of abdominal organs.  -GI symptoms have improved. Still present but more intermittent.       Current Medication: Outpatient Encounter Medications as of 09/25/2020  Medication Sig  . Accu-Chek FastClix Lancets MISC Use as directed  Twice day E11.65  . Continuous Blood Gluc Receiver (FREESTYLE LIBRE 14 DAY READER) DEVI 1 Units by Does not apply route daily.  . Continuous Blood Gluc Sensor (FREESTYLE LIBRE 14 DAY SENSOR) MISC Use as directed for continuous glucose monitoring  . glimepiride (AMARYL) 2 MG tablet Take 1 tablet (2 mg total) by mouth daily before breakfast.  . glucose blood (ACCU-CHEK GUIDE) test strip Blood sugar testing TID - dx E11.65  . ibuprofen (ADVIL) 800 MG tablet Take 1 tablet (800 mg total) by mouth every 8 (eight) hours as needed.  . insulin glargine, 2 Unit Dial, (TOUJEO MAX SOLOSTAR) 300 UNIT/ML Solostar Pen Inject up to 20 units [place] QD  . Insulin Pen Needle (FIFTY50 PEN NEEDLES) 32G X 4 MM MISC Use with Toujeo and victoza injections. Will need 2 needles per day. E11.65  . liraglutide (VICTOZA) 18 MG/3ML SOPN Inject 0.3 mLs (1.8 mg total) into the skin daily.  Please add to patient's med list.  . lisinopril (PRINIVIL,ZESTRIL) 5 MG tablet Take 1 tablet (5 mg total) by mouth daily. Please add on patient's med list  . rosuvastatin (CRESTOR) 10 MG tablet Take 1 tablet (10 mg total) by mouth at bedtime.  . [DISCONTINUED] Insulin Glargine, 2 Unit Dial, (TOUJEO MAX SOLOSTAR) 300 UNIT/ML SOPN Inject 15 Units into the skin daily.  . [DISCONTINUED] rosuvastatin (CRESTOR) 5 MG tablet Take 1 tablet (5 mg total) by mouth at bedtime.  . ergocalciferol (DRISDOL) 1.25 MG (50000 UT) capsule Take 1 capsule (50,000 Units total) by mouth once a week.  . [DISCONTINUED] amoxicillin-clavulanate (AUGMENTIN) 875-125 MG tablet Take 1 tablet by mouth 2 (two) times daily. (Patient not taking: Reported on 09/25/2020)   No facility-administered encounter medications on file as of 09/25/2020.    Surgical History: Past Surgical History:  Procedure Laterality Date  . BREAST SURGERY  2010   left breast tumor removal    Medical History: Past Medical History:  Diagnosis Date  . Cancer [facility])    left breast s/p left lumpectomy  . Diabetes mellitus without complication ([place])   . Hypertension     Family History: Family History  Problem Relation Age of Onset  . Hyperlipidemia Mother   . Diabetes Maternal Grandmother     Social History   Socioeconomic History  . Marital status: Married    Spouse name: Not on file  . Number of children: Not on file  . Years of  education: Not on file  . Highest education level: Not on file  Occupational History  . Not on file  Tobacco Use  . Smoking status: Former Smoker    Types: Cigarettes  . Smokeless tobacco: Never Used  Vaping Use  . Vaping Use: Never used  Substance and Sexual Activity  . Alcohol use: Yes    Comment: ocassionally  . Drug use: No  . Sexual activity: Never  Other Topics Concern  . Not on file  Social History Narrative  . Not on file   Social Determinants of Health   Financial Resource Strain: Not on  file  Food Insecurity: Not on file  Transportation Needs: Not on file  Physical Activity: Not on file  Stress: Not on file  Social Connections: Not on file  Intimate Partner Violence: Not on file      Review of Systems  Constitutional: Positive for fatigue. Negative for chills, fever and unexpected weight change.       Continues to have weight loss.   HENT: Negative for congestion, postnasal drip, rhinorrhea, sinus pressure, sinus pain, sneezing and sore throat.   Respiratory: Negative for cough, chest tightness, shortness of breath and wheezing.   Cardiovascular: Negative for chest pain and palpitations.  Gastrointestinal: Positive for nausea. Negative for abdominal pain, constipation, diarrhea and vomiting.       Abdominal discomfort   Endocrine: Negative for cold intolerance, heat intolerance, polydipsia and polyuria.       Slightly improved blood sugars from recent check. Still not taking her insulin dosing as prescribed.   Musculoskeletal: Negative for arthralgias, back pain, joint swelling and neck pain.  Skin: Negative for rash.  Allergic/Immunologic: Negative for environmental allergies.  Neurological: Positive for headaches. Negative for dizziness, tremors and numbness.  Hematological: Negative for adenopathy. Does not bruise/bleed easily.  Psychiatric/Behavioral: Negative for behavioral problems (Depression), sleep disturbance and suicidal ideas. The patient is nervous/anxious.     Today's Vitals   09/25/20 0841  BP: 106/86  Pulse: 88  Resp: 16  Temp: (!) 97.1 F (36.2 C)  SpO2: 99%  Weight: 146 lb 12.8 oz (66.6 kg)  Height: 5\' 6"  (1.676 m)   Body mass index is 23.69 kg/m.  Physical Exam Vitals and nursing note reviewed.  Constitutional:      General: She is not in acute distress.    Appearance: Normal appearance. She is well-developed, underweight and well-nourished. She is not diaphoretic.  HENT:     Head: Normocephalic and atraumatic.     Mouth/Throat:      Mouth: Oropharynx is clear and moist.     Pharynx: No oropharyngeal exudate.  Eyes:     Extraocular Movements: EOM normal.     Pupils: Pupils are equal, round, and reactive to light.  Neck:     Thyroid: No thyromegaly.     Vascular: No carotid bruit or JVD.     Trachea: No tracheal deviation.  Cardiovascular:     Rate and Rhythm: Normal rate and regular rhythm.     Heart sounds: Normal heart sounds. No murmur heard. No friction rub. No gallop.   Pulmonary:     Effort: Pulmonary effort is normal. No respiratory distress.     Breath sounds: Normal breath sounds. No wheezing or rales.  Chest:     Chest wall: No tenderness.  Abdominal:     General: Bowel sounds are normal.     Palpations: Abdomen is soft.     Tenderness: There is no abdominal tenderness.  Musculoskeletal:        General: Normal range of motion.     Cervical back: Normal range of motion and neck supple.  Lymphadenopathy:     Cervical: No cervical adenopathy.  Skin:    General: Skin is warm and dry.  Neurological:     Mental Status: She is alert and oriented to person, place, and time.     Cranial Nerves: No cranial nerve deficit.  Psychiatric:        Mood and Affect: Mood and affect and mood normal.        Behavior: Behavior normal.        Thought Content: Thought content normal.        Judgment: Judgment normal.     Assessment/Plan: 1. Type 2 diabetes mellitus with hyperglycemia, with long-term current use of insulin (HCC) HgbA1c 10.0 today, down from 11.0 at her last visit. Will have her gradually increase her toujeo dose up to 20 units everyday. Advised her to use sliding scale with regular insulin with every meal, based on blood sugar readings prior to meals. Continue other diabetic medication as prescribed. Continue to follow blood sugars closel.  - insulin glargine, 2 Unit Dial, (TOUJEO MAX SOLOSTAR) 300 UNIT/ML Solostar Pen; Inject up to 20 units [place] QD  Dispense: 9 mL; Refill: 1 - POCT HgB  A1C   2. Essential (primary) hypertension Continue lisinopril 5mg  daily.  3. Frequent diarrhea -abdominal ultrasound indicated left renal cyst without any acute abnormalities of abdominal organs.  Diarrhea may be related to chronically elevated blood sugars. Will continue to monitor.   4. Vitamin D deficiency Recent labs showing vitamin d deficiency. Start drisdol weekly.  - ergocalciferol (DRISDOL) 1.25 MG (50000 UT) capsule; Take 1 capsule (50,000 Units total) by mouth once a week.  Dispense: 4 capsule; Refill: 5  5. Hyperlipidemia, unspecified hyperlipidemia type Increase crestor to 10mg  daily with supper. Recheck lipid panel in three months.  - rosuvastatin (CRESTOR) 10 MG tablet; Take 1 tablet (10 mg total) by mouth at bedtime.  Dispense: 90 tablet; Refill: 1  General Counseling: Chella verbalizes understanding of the findings of todays visit and agrees with plan of treatment. I have discussed any further diagnostic evaluation that may be needed or ordered today. We also reviewed her medications today. she has been encouraged to call the office with any questions or concerns that should arise related to todays visit.  Diabetes Counseling:  1. Addition of ACE inh/ ARB'S for nephroprotection. Microalbumin is updated  2. Diabetic foot care, prevention of complications. Podiatry consult 3. Exercise and lose weight.  4. Diabetic eye examination, Diabetic eye exam is updated  5. Monitor blood sugar closlely. nutrition counseling.  6. Sign and symptoms of hypoglycemia including shaking sweating,confusion and headaches.  This patient was seen by Leretha Pol FNP Collaboration with Dr Lavera Guise as a part of collaborative care agreement  Orders Placed This Encounter  Procedures  . POCT HgB A1C    Meds ordered this encounter  Medications  . ergocalciferol (DRISDOL) 1.25 MG (50000 UT) capsule    Sig: Take 1 capsule (50,000 Units total) by mouth once a week.    Dispense:  4 capsule     Refill:  5    Order Specific Question:   Supervising Provider    Answer:   Lavera Guise [[id number]]  . insulin glargine, 2 Unit Dial, (TOUJEO MAX SOLOSTAR) 300 UNIT/ML Solostar Pen    Sig: Inject up to 20 units [place] QD  Dispense:  9 mL    Refill:  1    Gradually increased dose from 12 to 20 units daily    Order Specific Question:   Supervising Provider    Answer:   Lavera Guise [place]  . rosuvastatin (CRESTOR) 10 MG tablet    Sig: Take 1 tablet (10 mg total) by mouth at bedtime.    Dispense:  90 tablet    Refill:  1    Please add to patient's med lis    Order Specific Question:   Supervising Provider    Answer:   Lavera Guise [[id number]]    Total time spent: 35 Minutes   Time spent includes review of chart, medications, test results, and follow up plan with the patient.      Dr Lavera Guise Internal medicine

## 2020-10-26 DIAGNOSIS — E559 Vitamin D deficiency, unspecified: Secondary | ICD-10-CM | POA: Insufficient documentation

## 2020-11-01 ENCOUNTER — Other Ambulatory Visit: Payer: Managed Care, Other (non HMO)

## 2020-11-13 IMAGING — DX DG CHEST 1V PORT
1 series · 1 of 1 positions shown · non-contrast
Comparison: 08/26/2007

CLINICAL DATA: Shortness of breath

EXAM:
PORTABLE CHEST 1 VIEW

[chest ap]
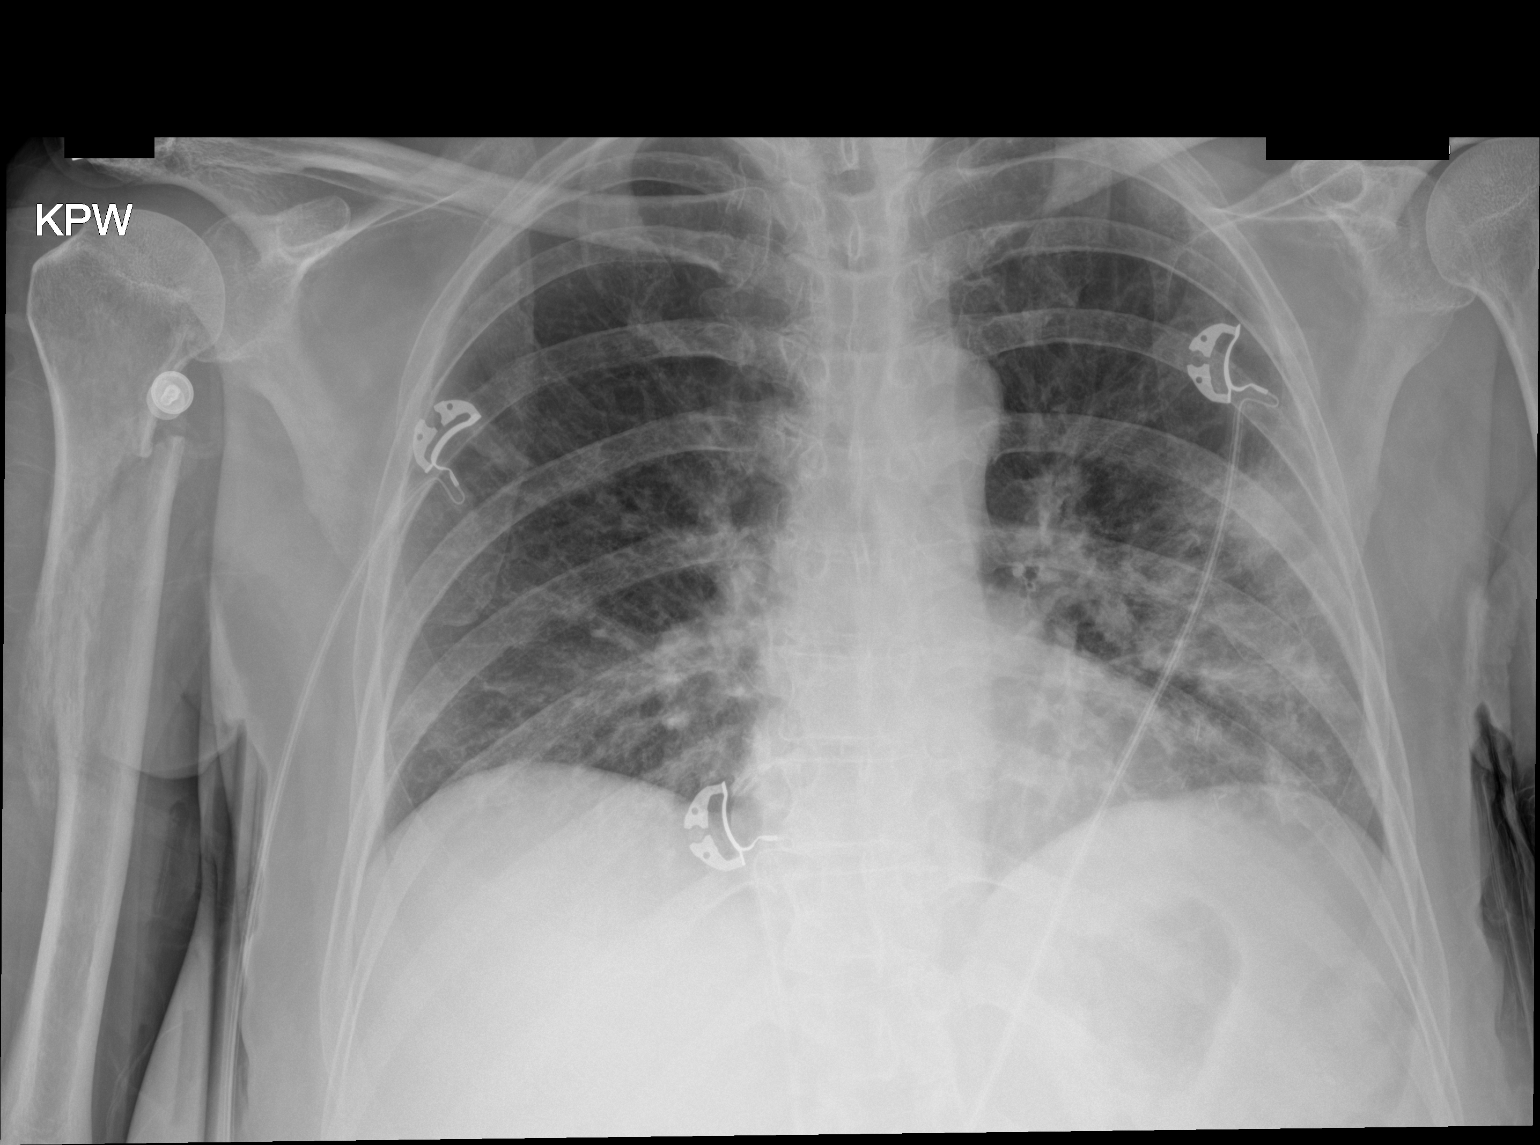

[1 of 1 positions shown; findings below may reference images not displayed]

FINDINGS: Patchy airspace disease in the lower lungs bilaterally, left greater
than right concerning for pneumonia. Heart is normal size. No
effusions. No acute bony abnormality.
IMPRESSION: Patchy bilateral lower lobe airspace opacities concerning for
pneumonia.

## 2020-11-18 ENCOUNTER — Other Ambulatory Visit: Payer: Self-pay

## 2020-11-18 MED ORDER — ACCU-CHEK GUIDE ME W/DEVICE KIT: PACK | 0 refills | Status: AC

## 2020-12-19 ENCOUNTER — Other Ambulatory Visit: Payer: Self-pay

## 2020-12-19 ENCOUNTER — Ambulatory Visit (INDEPENDENT_AMBULATORY_CARE_PROVIDER_SITE_OTHER): Payer: Managed Care, Other (non HMO) | Admitting: Podiatry

## 2020-12-19 ENCOUNTER — Encounter: Payer: Self-pay | Admitting: Podiatry

## 2020-12-19 DIAGNOSIS — E1165 Type 2 diabetes mellitus with hyperglycemia: Secondary | ICD-10-CM | POA: Diagnosis not present

## 2020-12-19 DIAGNOSIS — M25474 Effusion, right foot: Secondary | ICD-10-CM | POA: Diagnosis not present

## 2020-12-19 NOTE — Progress Notes (Signed)
This patient presents to the office for her annual diabetic foot exam.  Patient says she has been dieting and her blood sugar has decreased.  She says she has tightness of her feet when trying to move her toes both feet.  She presents for an evaluation and treatment.  Vascular  Dorsalis pedis and posterior tibial pulses are palpable  B/L.  Capillary return  WNL.  Temperature gradient is  WNL.  Skin turgor  WNL  Sensorium  Senn Weinstein monofilament wire  WNL. Normal tactile sensation.  Nail Exam  Patient has normal nails with no evidence of bacterial or fungal infection.  Orthopedic  Exam  Muscle tone and muscle strength  WNL.  No limitations of motion feet  B/L.  No crepitus or joint effusion noted.  Mild  HAV  B/L.  Hammer toes  2-4  B/L.  Skin  No open lesions.  Normal skin texture and turgor.  Diabetes  Swelling right foot/leg.  IE.  No evidence of vascular or neurologic pathology.  Swelling right foot/leg possibly caused by venous pathology right foot/leg.  Dispense anklet and watch her swelling in the future.   Gardiner Barefoot DPM

## 2020-12-24 ENCOUNTER — Ambulatory Visit: Payer: Managed Care, Other (non HMO) | Admitting: Hospice and Palliative Medicine

## 2021-01-20 ENCOUNTER — Ambulatory Visit: Payer: Managed Care, Other (non HMO) | Admitting: Hospice and Palliative Medicine

## 2021-05-29 ENCOUNTER — Encounter: Payer: Managed Care, Other (non HMO) | Admitting: Physician Assistant

## 2021-06-05 ENCOUNTER — Encounter: Payer: Managed Care, Other (non HMO) | Admitting: Physician Assistant

## 2021-07-18 ENCOUNTER — Telehealth: Payer: Self-pay

## 2021-07-18 ENCOUNTER — Other Ambulatory Visit: Payer: Self-pay

## 2021-07-18 DIAGNOSIS — Z794 Long term (current) use of insulin: Secondary | ICD-10-CM

## 2021-07-18 MED ORDER — TOUJEO MAX SOLOSTAR 300 UNIT/ML ~~LOC~~ SOPN: PEN_INJECTOR | SUBCUTANEOUS | 0 refills | Status: AC

## 2021-07-18 NOTE — Telephone Encounter (Signed)
Pt called that need refills for toujeo advised we do it for 1 month and keep follow up she was not seen since 12/21 due to family  emergency

## 2021-08-15 ENCOUNTER — Other Ambulatory Visit: Payer: Self-pay

## 2021-08-15 ENCOUNTER — Encounter: Payer: Self-pay | Admitting: Physician Assistant

## 2021-08-15 ENCOUNTER — Ambulatory Visit (INDEPENDENT_AMBULATORY_CARE_PROVIDER_SITE_OTHER): Payer: Managed Care, Other (non HMO) | Admitting: Physician Assistant

## 2021-08-15 VITALS — BP 104/78 | HR 90 | Temp 98.3°F | Resp 16 | Ht 66.0 in | Wt 139.8 lb

## 2021-08-15 DIAGNOSIS — Z794 Long term (current) use of insulin: Secondary | ICD-10-CM | POA: Diagnosis not present

## 2021-08-15 DIAGNOSIS — Z0001 Encounter for general adult medical examination with abnormal findings: Secondary | ICD-10-CM | POA: Diagnosis not present

## 2021-08-15 DIAGNOSIS — E785 Hyperlipidemia, unspecified: Secondary | ICD-10-CM

## 2021-08-15 DIAGNOSIS — E1165 Type 2 diabetes mellitus with hyperglycemia: Secondary | ICD-10-CM | POA: Diagnosis not present

## 2021-08-15 DIAGNOSIS — E559 Vitamin D deficiency, unspecified: Secondary | ICD-10-CM

## 2021-08-15 DIAGNOSIS — I10 Essential (primary) hypertension: Secondary | ICD-10-CM | POA: Diagnosis not present

## 2021-08-15 DIAGNOSIS — Z1211 Encounter for screening for malignant neoplasm of colon: Secondary | ICD-10-CM

## 2021-08-15 DIAGNOSIS — E538 Deficiency of other specified B group vitamins: Secondary | ICD-10-CM

## 2021-08-15 DIAGNOSIS — Z1212 Encounter for screening for malignant neoplasm of rectum: Secondary | ICD-10-CM

## 2021-08-15 DIAGNOSIS — R300 Dysuria: Secondary | ICD-10-CM | POA: Diagnosis not present

## 2021-08-15 DIAGNOSIS — R5383 Other fatigue: Secondary | ICD-10-CM

## 2021-08-15 LAB — POCT GLYCOSYLATED HEMOGLOBIN (HGB A1C): Hemoglobin A1C: 11.7 % — AB (ref 4.0–5.6)

## 2021-08-15 MED ORDER — VICTOZA 18 MG/3ML ~~LOC~~ SOPN: 1.8000 mg | PEN_INJECTOR | Freq: Every day | SUBCUTANEOUS | 1 refills | Status: AC

## 2021-08-15 NOTE — Progress Notes (Signed)
[facility] [place], [place] [address]  Internal MEDICINE  Office Visit Note  Patient Name: Kristie Bowman  [id number]  [id number]  Date of Service: 08/15/2021  Chief Complaint  Patient presents with   Annual Exam   Diabetes   Hypertension    HPI Pt is here for her CPE. -She has not been seen in a long time due to her daughter being critically ill after an accident. She reports she is doing better now and is trying to get back on top of her health. -Eye exam done this week. -BG this morning 240 and states this is a good reading for her, can be higher.  -Taking victoza in [place], has not taken any toujeo recently and was previously told to take 20units. Will restart this, but will also refer to endocrinology to help get her diabetes under control since it has always been uncontrolled. -Saw podiatry for foot exam 5-6 months ago. Was told everything was fine. Denies problems with sensation. Advised to inspect feet regularly to look for any wounds. -BP at home not checked. Discussed stopping lisinopril sinc ebP borderline low in office. Will reconsider if BP rising at home without medication. -Continues to take Crestor 3x per week and is able to tolerate this. Does get some cramping if taken too frequently. -her Mammogram is scheduled on Dec 8 and has f/u with [facility] provider since she is a cancer survivor -Due for colon cancer screening and would like to do cologaurd -routine labs ordered -Due for pap and will do this at next visit  Current Medication: Outpatient Encounter Medications as of 08/15/2021  Medication Sig   Accu-Chek FastClix Lancets MISC Use as directed  Twice day E11.65   Blood Glucose Monitoring Suppl (ACCU-CHEK GUIDE ME) w/Device KIT Check blood sugar three times daily   ergocalciferol (DRISDOL) 1.25 MG (50000 UT) capsule Take 1 capsule (50,000 Units total) by mouth once a week.   glimepiride (AMARYL) 2 MG tablet Take 1 tablet (2 mg total) by  mouth daily before breakfast.   glucose blood (ACCU-CHEK GUIDE) test strip Blood sugar testing TID - dx E11.65   ibuprofen (ADVIL) 800 MG tablet Take 1 tablet (800 mg total) by mouth every 8 (eight) hours as needed.   insulin glargine, 2 Unit Dial, (TOUJEO MAX SOLOSTAR) 300 UNIT/ML Solostar Pen Inject up to 20 units [place] QD   Insulin Pen Needle (FIFTY50 PEN NEEDLES) 32G X 4 MM MISC Use with Toujeo and victoza injections. Will need 2 needles per day. E11.65   liraglutide (VICTOZA) 18 MG/3ML SOPN Inject 1.8 mg into the skin daily. Please add to patient's med list.   lisinopril (PRINIVIL,ZESTRIL) 5 MG tablet Take 1 tablet (5 mg total) by mouth daily. Please add on patient's med list   rosuvastatin (CRESTOR) 10 MG tablet Take 1 tablet (10 mg total) by mouth at bedtime.   timolol (TIMOPTIC) 0.5 % ophthalmic solution PLACE 1 DROP INTO BOTH EYES EVERY MORNING   [DISCONTINUED] Continuous Blood Gluc Sensor (FREESTYLE LIBRE 14 DAY SENSOR) MISC Use as directed for continuous glucose monitoring (Patient not taking: No sig reported)   [DISCONTINUED] liraglutide (VICTOZA) 18 MG/3ML SOPN Inject 0.3 mLs (1.8 mg total) into the skin daily. Please add to patient's med list.   No facility-administered encounter medications on file as of 08/15/2021.    Surgical History: Past Surgical History:  Procedure Laterality Date   BREAST SURGERY  2010   left breast tumor removal    Medical History:  Past Medical History:  Diagnosis Date   Cancer ([place])    left breast s/p left lumpectomy   Diabetes mellitus without complication ([place])    Hypertension     Family History: Family History  Problem Relation Age of Onset   Hyperlipidemia Mother    Dementia Mother    Diabetes Maternal Grandmother    Cancer Paternal Grandmother     Social History   Socioeconomic History   Marital status: Widowed    Spouse name: Not on file   Number of children: Not on file   Years of education: Not on file   Highest education  level: Not on file  Occupational History   Not on file  Tobacco Use   Smoking status: Former    Types: Cigarettes   Smokeless tobacco: Never  Vaping Use   Vaping Use: Never used  Substance and Sexual Activity   Alcohol use: Yes    Comment: ocassionally   Drug use: No   Sexual activity: Never  Other Topics Concern   Not on file  Social History Narrative   Not on file   Social Determinants of Health   Financial Resource Strain: Not on file  Food Insecurity: Not on file  Transportation Needs: Not on file  Physical Activity: Not on file  Stress: Not on file  Social Connections: Not on file  Intimate Partner Violence: Not on file      Review of Systems  Constitutional:  Negative for chills, fatigue and unexpected weight change.  HENT:  Negative for congestion, postnasal drip, rhinorrhea, sneezing and sore throat.   Eyes:  Negative for redness.  Respiratory:  Negative for cough, chest tightness and shortness of breath.   Cardiovascular:  Negative for chest pain and palpitations.  Gastrointestinal:  Negative for abdominal pain, constipation, diarrhea, nausea and vomiting.  Genitourinary:  Negative for dysuria and frequency.  Musculoskeletal:  Negative for arthralgias, back pain, joint swelling and neck pain.  Skin:  Negative for rash.  Neurological: Negative.  Negative for tremors and numbness.  Hematological:  Negative for adenopathy. Does not bruise/bleed easily.  Psychiatric/Behavioral:  Negative for behavioral problems (Depression), sleep disturbance and suicidal ideas. The patient is not nervous/anxious.    Vital Signs: BP 104/78   Pulse 90   Temp 98.3 F (36.8 C)   Resp 16   Ht $R'5\' 6"'Od$  (1.676 m)   Wt 139 lb 12.8 oz (63.4 kg)   SpO2 99%   BMI 22.56 kg/m    Physical Exam Vitals and nursing note reviewed.  Constitutional:      General: She is not in acute distress.    Appearance: She is well-developed and normal weight. She is not diaphoretic.  HENT:      Head: Normocephalic and atraumatic.     Right Ear: External ear normal.     Left Ear: External ear normal.     Nose: Nose normal.     Mouth/Throat:     Pharynx: No oropharyngeal exudate.  Eyes:     General: No scleral icterus.       Right eye: No discharge.        Left eye: No discharge.     Conjunctiva/sclera: Conjunctivae normal.     Pupils: Pupils are equal, round, and reactive to light.  Neck:     Thyroid: No thyromegaly.     Vascular: No JVD.     Trachea: No tracheal deviation.  Cardiovascular:     Rate and Rhythm: Normal rate and regular rhythm.  Heart sounds: Normal heart sounds. No murmur heard.   No friction rub. No gallop.  Pulmonary:     Effort: Pulmonary effort is normal. No respiratory distress.     Breath sounds: Normal breath sounds. No stridor. No wheezing or rales.  Chest:     Chest wall: No tenderness.  Abdominal:     General: Bowel sounds are normal. There is no distension.     Palpations: Abdomen is soft. There is no mass.     Tenderness: There is no abdominal tenderness. There is no guarding or rebound.  Musculoskeletal:        General: No tenderness or deformity. Normal range of motion.     Cervical back: Normal range of motion and neck supple.  Lymphadenopathy:     Cervical: No cervical adenopathy.  Skin:    General: Skin is warm and dry.     Coloration: Skin is not pale.     Findings: No erythema or rash.  Neurological:     Mental Status: She is alert.     Cranial Nerves: No cranial nerve deficit.     Motor: No abnormal muscle tone.     Coordination: Coordination normal.     Deep Tendon Reflexes: Reflexes are normal and symmetric.  Psychiatric:        Behavior: Behavior normal.        Thought Content: Thought content normal.        Judgment: Judgment normal.       Assessment/Plan: 1. Encounter for general adult medical examination with abnormal findings CPE performed, mammogram already scheduled, will routine labs ordered and Cologuard  order placed.  Will need Pap at next visit  2. Type 2 diabetes mellitus with hyperglycemia, with long-term current use of insulin (HCC) - POCT HgB A1C 11.7 today which is raised from 10 at last check 10 months ago.  Patient has not been taking her Toujeo but will start back.  She continues to take Victoza.  Will refer to endocrinology for further management due to continued poor control of diabetes.  Advised to monitor sugars closely and work on diet and exercise - Ambulatory referral to Endocrinology - liraglutide (VICTOZA) 18 MG/3ML SOPN; Inject 1.8 mg into the skin daily. Please add to patient's med list.  Dispense: 5 mL; Refill: 1  3. Essential (primary) hypertension Will hold lisinopril given low BP in office today.  Patient to monitor at home and call if rising.  4. Hyperlipidemia, unspecified hyperlipidemia type Continue Crestor, will update labs  5. Screening for colorectal cancer - Cologuard  6. Vitamin D deficiency - VITAMIN D 25 Hydroxy (Vit-D Deficiency, Fractures)  7. B12 deficiency - B12 and Folate Panel  8. Other fatigue - CBC w/Diff/Platelet - Comprehensive metabolic panel - TSH + free T4 - Lipid Panel With LDL/HDL Ratio   General Counseling: Nadalee verbalizes understanding of the findings of todays visit and agrees with plan of treatment. I have discussed any further diagnostic evaluation that may be needed or ordered today. We also reviewed her medications today. she has been encouraged to call the office with any questions or concerns that should arise related to todays visit.    Orders Placed This Encounter  Procedures   CBC w/Diff/Platelet   Comprehensive metabolic panel   TSH + free T4   VITAMIN D 25 Hydroxy (Vit-D Deficiency, Fractures)   B12 and Folate Panel   Lipid Panel With LDL/HDL Ratio   Cologuard   Ambulatory referral to Endocrinology   POCT HgB  A1C    Meds ordered this encounter  Medications   liraglutide (VICTOZA) 18 MG/3ML SOPN    Sig:  Inject 1.8 mg into the skin daily. Please add to patient's med list.    Dispense:  5 mL    Refill:  1    This patient was seen by Drema Dallas, PA-C in collaboration with Dr. Clayborn Bigness as a part of collaborative care agreement.   Total time spent:35 Minutes Time spent includes review of chart, medications, test results, and follow up plan with the patient.      Dr Lavera Guise Internal medicine

## 2021-08-15 NOTE — Addendum Note (Signed)
Addended by: Corlis Hove on: 08/15/2021 03:52 PM   Modules accepted: Orders

## 2021-08-18 ENCOUNTER — Telehealth: Payer: Self-pay

## 2021-08-18 NOTE — Telephone Encounter (Signed)
Endocrinology referral manually faxed to Dr. Rayburn Ma

## 2021-08-22 LAB — UA/M W/RFLX CULTURE, ROUTINE

## 2021-08-26 NOTE — Telephone Encounter (Signed)
Called office to see if patient has been scheduled. They stated they did not receive. I manually faxed again-Toni

## 2021-09-26 ENCOUNTER — Other Ambulatory Visit: Payer: Self-pay

## 2021-09-26 ENCOUNTER — Encounter: Payer: Managed Care, Other (non HMO) | Attending: Endocrinology | Admitting: *Deleted

## 2021-09-26 ENCOUNTER — Encounter: Payer: Self-pay | Admitting: *Deleted

## 2021-09-26 VITALS — BP 108/70 | Ht 66.0 in | Wt 137.5 lb

## 2021-09-26 DIAGNOSIS — H409 Unspecified glaucoma: Secondary | ICD-10-CM | POA: Insufficient documentation

## 2021-09-26 DIAGNOSIS — E785 Hyperlipidemia, unspecified: Secondary | ICD-10-CM | POA: Insufficient documentation

## 2021-09-26 DIAGNOSIS — Z794 Long term (current) use of insulin: Secondary | ICD-10-CM

## 2021-09-26 DIAGNOSIS — E1165 Type 2 diabetes mellitus with hyperglycemia: Secondary | ICD-10-CM | POA: Insufficient documentation

## 2021-09-26 NOTE — Patient Instructions (Signed)
Check blood sugars 2 x day before breakfast and 2 hrs after supper every day or as directed by MD  Exercise: Begin walking for    15  minutes   3  days a week and gradually increase to 30 minutes 5 x week  Eat 3 meals day,   1-2  snacks a day Space meals 4-6 hours apart Limit fried foods, desserts and sweets Avoid sugar sweetened drinks (tea)  Carry fast acting glucose and a snack at all times Rotate injection sites Hold the insulin pen in place for 5-10 seconds after injection  Call back if you want to schedule an appointment with the nurse or dietitian

## 2021-09-26 NOTE — Progress Notes (Signed)
Diabetes Self-Management Education  Visit Type: First/Initial  Appt. Start Time: 1025 Appt. End Time: 1140  09/26/2021  [name], identified by name and date of birth, is a 60 y.o. female with a diagnosis of Diabetes: Type 2.   ASSESSMENT  Blood pressure 108/70, height 5\' 6"  (1.676 m), weight 137 lb 8 oz (62.4 kg). Body mass index is 22.19 kg/m.   Diabetes Self-Management Education - 09/26/21 1327       Visit Information   Visit Type First/Initial      Initial Visit   Diabetes Type Type 2    Are you currently following a meal plan? No    Are you taking your medications as prescribed? Yes    Date Diagnosed 10 years      Health Coping   How would you rate your overall health? Fair      Psychosocial Assessment   Patient Belief/Attitude about Diabetes Other (comment)   "I don't like it"   Self-care barriers None    Self-management support Doctor's office    Patient Concerns Nutrition/Meal planning;Medication;Glycemic Control    Special Needs None    Preferred Learning Style Hands on    Learning Readiness Ready    How often do you need to have someone help you when you read instructions, pamphlets, or other written materials from your doctor or pharmacy? 1 - Never    What is the last grade level you completed in school? 12th      Pre-Education Assessment   Patient understands the diabetes disease and treatment process. Needs Review    Patient understands incorporating nutritional management into lifestyle. Needs Instruction    Patient undertands incorporating physical activity into lifestyle. Needs Instruction    Patient understands using medications safely. Needs Review    Patient understands monitoring blood glucose, interpreting and using results Needs Review    Patient understands prevention, detection, and treatment of acute complications. Needs Instruction    Patient understands prevention, detection, and treatment of chronic complications. Needs Review     Patient understands how to develop strategies to address psychosocial issues. Needs Instruction    Patient understands how to develop strategies to promote health/change behavior. Needs Instruction      Complications   Last HgB A1C per patient/outside source 11.7 %   08/15/2021   How often do you check your blood sugar? 1-2 times/day   Pt reports all blood sugars are 272 mg/dL and higher. Some readings in the 400's mg/dL.   Fasting Blood glucose range (mg/dL) >200    Postprandial Blood glucose range (mg/dL) >200    Have you had a dilated eye exam in the past 12 months? Yes    Have you had a dental exam in the past 12 months? Yes    Are you checking your feet? Yes    How many days per week are you checking your feet? 2      Dietary Intake   Breakfast bagel plain, grapes; chicken biscuit with potatoes and sweet tea; eggs, bacon, toast on week-ends    Snack (morning) "junk food" - cookies, ice cream    Lunch frozen meal; soup; [place] sandiwch    Snack (afternoon) junk food or fruit (grapes, orange, fruit tray)    Dinner chick-fil-a; spaghetti; chili; [place] food with rice, beef and broccoli; green beans, corn, pinto beans, rice, pasta, salad with lettuce, tomato, cuccumbers, ham, raspberry dressing    Beverage(s) water, diet soda, diet lemonade, sugar-sweetened tea  Exercise   Exercise Type ADL's      Patient Education   Previous Diabetes Education Yes (please comment)   appt with dietitian here - ? 10 years ago   Disease state  Definition of diabetes, type 1 and 2, and the diagnosis of diabetes;Factors that contribute to the development of diabetes    Nutrition management  Role of diet in the treatment of diabetes and the relationship between the three main macronutrients and blood glucose level;Food label reading, portion sizes and measuring food.;Carbohydrate counting;Reviewed blood glucose goals for pre and post meals and how to evaluate the patients' food intake on their blood  glucose level.    Physical activity and exercise  Role of exercise on diabetes management, blood pressure control and cardiac health.    Medications Taught/reviewed insulin injection, site rotation, insulin storage and needle disposal.;Reviewed patients medication for diabetes, action, purpose, timing of dose and side effects.    Monitoring Purpose and frequency of SMBG.;Taught/discussed recording of test results and interpretation of SMBG.;Identified appropriate SMBG and/or A1C goals. Discussed use of CGM's. She will contact her insurance plan regarding coverage of Dexcom since they don't cover FreeStyle Libre.   Acute complications Taught treatment of hypoglycemia - the 15 rule.    Chronic complications Relationship between chronic complications and blood glucose control    Psychosocial adjustment Role of stress on diabetes;Identified and addressed patients feelings and concerns about diabetes      Individualized Goals (developed by patient)   Reducing Risk Other (comment)   improve blood sugars, decrease medications     Outcomes   Expected Outcomes Demonstrated interest in learning. Expect positive outcomes    Program Status Not Completed        Individualized Plan for Diabetes Self-Management Training:   Learning Objective:  Patient will have a greater understanding of diabetes self-management. Patient education plan is to attend individual and/or group sessions per assessed needs and concerns.   Plan:   Patient Instructions  Check blood sugars 2 x day before breakfast and 2 hrs after supper every day or as directed by MD  Exercise: Begin walking for    15  minutes   3  days a week and gradually increase to 30 minutes 5 x week  Eat 3 meals day,   1-2  snacks a day Space meals 4-6 hours apart Limit fried foods, desserts and sweets Avoid sugar sweetened drinks (tea)  Carry fast acting glucose and a snack at all times Rotate injection sites Hold the insulin pen in place for 5-10  seconds after injection  Call back if you want to schedule an appointment with the nurse or dietitian  Expected Outcomes:  Demonstrated interest in learning. Expect positive outcomes  Education material provided: General Meal Planning Guidelines Simple Meal Plan Glucose tablets Symptoms, causes and treatments of Hypoglycemia Injection Guide (BD) Healthy Snack Options (ADA)  If problems or questions, patient to contact team via:   Johny Drilling, RN, [place] [phone]  Future DSME appointment: The patient doesn't want to return for further Diabetes education at this time.

## 2021-09-30 NOTE — Telephone Encounter (Signed)
Patient was seen @ Dr. De [place]ings[place] office 09/23/21-Toni

## 2021-11-13 ENCOUNTER — Ambulatory Visit: Payer: Managed Care, Other (non HMO) | Admitting: Physician Assistant

## 2022-01-30 ENCOUNTER — Encounter: Payer: Self-pay | Admitting: Internal Medicine

## 2022-02-03 ENCOUNTER — Encounter: Payer: Self-pay | Admitting: Emergency Medicine

## 2022-02-03 ENCOUNTER — Ambulatory Visit
Admission: EM | Admit: 2022-02-03 | Discharge: 2022-02-03 | Disposition: A | Discharge status: Home / Self Care | Payer: Managed Care, Other (non HMO) | Attending: Emergency Medicine | Admitting: Emergency Medicine

## 2022-02-03 DIAGNOSIS — J020 Streptococcal pharyngitis: Secondary | ICD-10-CM | POA: Diagnosis not present

## 2022-02-03 LAB — POCT RAPID STREP A (OFFICE): Rapid Strep A Screen: POSITIVE — AB

## 2022-02-03 MED ORDER — AMOXICILLIN 500 MG PO CAPS: 500.0000 mg | ORAL_CAPSULE | Freq: Two times a day (BID) | ORAL | 0 refills | Status: AC

## 2022-02-03 NOTE — ED Triage Notes (Signed)
Pt here with sore throat x 4 days and direct exposure to strep throat ?

## 2022-02-03 NOTE — Discharge Instructions (Addendum)
You have strep throat.  Take the amoxicillin as directed.  Follow up with your primary care provider if your symptoms are not improving.   ? ?

## 2022-02-03 NOTE — ED Provider Notes (Signed)
She is ?[facility] ? ? ? ?CSN: [id number] ?Arrival date & time: 02/03/22  1808 ? ? ?  ? ?History   ?Chief Complaint ?Chief Complaint  ?Patient presents with  ? Sore Throat  ? ? ?HPI ?Kristie Bowman is a 61 y.o. female.  Patient presents with 2 day history of sore throat.  No fever, rash, cough, shortness of breath, or other symptoms.  Treatment at home with Tylenol.  Her daughter and granddaughter that live with her or diagnosed with strep throat today.  Her medical history includes diabetes, hypertension, breast cancer. ? ?The history is provided by the patient and medical records.  ? ?Past Medical History:  ?Diagnosis Date  ? Cancer [facility])   ? left breast s/p left lumpectomy  ? Diabetes mellitus without complication ([place])   ? Hypertension   ? ? ?Patient Active Problem List  ? Diagnosis Date Noted  ? Swelling of foot joint, right 12/19/2020  ? Vitamin D deficiency 10/26/2020  ? Encounter for general adult medical examination with abnormal findings 06/12/2020  ? Dysuria 06/12/2020  ? Pneumonia due to COVID-19 virus 11/02/2019  ? Closed displaced oblique fracture of shaft of right humerus 08/31/2019  ? Intractable vomiting 01/17/2019  ? Acute intractable headache 01/17/2019  ? Uncontrolled type 2 diabetes mellitus with hyperglycemia ([place]) 07/18/2018  ? Needs flu shot 07/18/2018  ? Screening for colon cancer 07/18/2018  ? Type 2 diabetes mellitus with mild nonproliferative diabetic retinopathy with macular edema, bilateral ([place]) 10/15/2017  ? Essential (primary) hypertension 10/15/2017  ? Patient's noncompliance with other medical treatment and regimen 10/15/2017  ? Frequent diarrhea 10/15/2017  ? Other fatigue 10/15/2017  ? Type 2 diabetes mellitus with hyperglycemia ([place]) 10/15/2017  ? Personal history of malignant neoplasm of breast 10/15/2017  ? Low back pain 10/15/2017  ? Hyperlipidemia 10/15/2017  ? ? ?Past Surgical History:  ?Procedure Laterality Date  ? BREAST SURGERY  2010  ? left breast tumor removal   ? ? ?OB History   ?No obstetric history on file. ?  ? ? ? ?Home Medications   ? ?Prior to Admission medications   ?Medication Sig Start Date End Date Taking? Authorizing Provider  ?amoxicillin (AMOXIL) 500 MG capsule Take 1 capsule (500 mg total) by mouth 2 (two) times daily for 10 days. 02/03/22 02/13/22 Yes Sharion Balloon, NP  ?Accu-Chek FastClix Lancets MISC Use as directed  Twice day E11.65 08/28/19   Ronnell Freshwater, NP  ?Blood Glucose Monitoring Suppl (ACCU-CHEK GUIDE ME) w/Device KIT Check blood sugar three times daily 11/18/20   Lavera Guise, MD  ?Calcium Carb-Cholecalciferol (CALCIUM 500/VITAMIN D) 500-3.125 MG-MCG TABS Take 1 tablet by mouth daily.    [provider]  ?ergocalciferol (DRISDOL) 1.25 MG (50000 UT) capsule Take 1 capsule (50,000 Units total) by mouth once a week. 09/25/20   Ronnell Freshwater, NP  ?glucose blood (ACCU-CHEK GUIDE) test strip Blood sugar testing TID - dx E11.65 08/30/19   Ronnell Freshwater, NP  ?ibuprofen (ADVIL) 800 MG tablet Take 1 tablet (800 mg total) by mouth every 8 (eight) hours as needed. 09/05/19   Leandrew Koyanagi, MD  ?insulin glargine, 2 Unit Dial, (TOUJEO MAX SOLOSTAR) 300 UNIT/ML Solostar Pen Inject up to 20 units [place] QD ?Patient taking differently: 12 Units daily. Inject up to 20 units [place] QD 07/18/21   Lavera Guise, MD  ?Insulin Pen Needle (FIFTY50 PEN NEEDLES) 32G X 4 MM MISC Use with Toujeo and victoza injections. Will need 2  needles per day. E11.65 04/19/20   Ronnell Freshwater, NP  ?liraglutide (VICTOZA) 18 MG/3ML SOPN Inject 1.8 mg into the skin daily. Please add to patient's med list. 08/15/21   McDonough, Si Gaul, PA-C  ?rosuvastatin (CRESTOR) 10 MG tablet Take 1 tablet (10 mg total) by mouth at bedtime. 09/25/20   Ronnell Freshwater, NP  ?timolol (TIMOPTIC) 0.5 % ophthalmic solution PLACE 1 DROP INTO BOTH EYES EVERY MORNING 01/06/17   [provider]  ? ? ?Family History ?Family History  ?Problem Relation Age of Onset  ? Hyperlipidemia Mother   ?  Dementia Mother   ? Diabetes Brother   ? Diabetes Maternal Grandmother   ? Cancer Paternal Grandmother   ? ? ?Social History ?Social History  ? ?Tobacco Use  ? Smoking status: Former  ?  Packs/day: 1.00  ?  Years: 25.00  ?  Pack years: 25.00  ?  Types: Cigarettes  ?  Quit date: 10/19/2006  ?  Years since quitting: 15.3  ? Smokeless tobacco: Never  ?Vaping Use  ? Vaping Use: Never used  ?Substance Use Topics  ? Alcohol use: Not Currently  ?  Comment: ocassionally  ? Drug use: No  ? ? ? ?Allergies   ?Codeine ? ? ?Review of Systems ?Review of Systems  ?Constitutional:  Negative for chills and fever.  ?HENT:  Positive for sore throat. Negative for ear pain.   ?Respiratory:  Negative for cough and shortness of breath.   ?Gastrointestinal:  Negative for diarrhea and vomiting.  ?Skin:  Negative for color change and rash.  ?All other systems reviewed and are negative. ? ? ?Physical Exam ?Triage Vital Signs ?ED Triage Vitals  ?Enc Vitals Group  ?   BP   ?   Pulse   ?   Resp   ?   Temp   ?   Temp src   ?   SpO2   ?   Weight   ?   Height   ?   Head Circumference   ?   Peak Flow   ?   Pain Score   ?   Pain Loc   ?   Pain Edu?   ?   Excl. in [place]?   ? ?No data found. ? ?Updated Vital Signs ?BP 111/75   Pulse 84   Temp 98.2 ?F (36.8 ?C)   Resp 18   SpO2 98%  ? ?Visual Acuity ?Right Eye Distance:   ?Left Eye Distance:   ?Bilateral Distance:   ? ?Right Eye Near:   ?Left Eye Near:    ?Bilateral Near:    ? ?Physical Exam ?Vitals and nursing note reviewed.  ?Constitutional:   ?   General: She is not in acute distress. ?   Appearance: Normal appearance. She is well-developed. She is not ill-appearing.  ?HENT:  ?   Right Ear: Tympanic membrane normal.  ?   Left Ear: Tympanic membrane normal.  ?   Nose: Nose normal.  ?   Mouth/Throat:  ?   Mouth: Mucous membranes are moist.  ?   Pharynx: Posterior oropharyngeal erythema present.  ?Cardiovascular:  ?   Rate and Rhythm: Normal rate and regular rhythm.  ?   Heart sounds: Normal heart  sounds.  ?Pulmonary:  ?   Effort: Pulmonary effort is normal. No respiratory distress.  ?   Breath sounds: Normal breath sounds.  ?Musculoskeletal:  ?   Cervical back: Neck supple.  ?Skin: ?   General: Skin is warm  and dry.  ?   Capillary Refill: Capillary refill takes less than 2 seconds.  ?Neurological:  ?   Mental Status: She is alert.  ?Psychiatric:     ?   Mood and Affect: Mood normal.     ?   Behavior: Behavior normal.  ? ? ? ?UC Treatments / Results  ?Labs ?(all labs ordered are listed, but only abnormal results are displayed) ?Labs Reviewed  ?POCT RAPID STREP A (OFFICE) - Abnormal; Notable for the following components:  ?    Result Value  ? Rapid Strep A Screen Positive (*)   ? All other components within normal limits  ? ? ?EKG ? ? ?Radiology ?No results found. ? ?Procedures ?Procedures (including critical care time) ? ?Medications Ordered in UC ?Medications - No data to display ? ?Initial Impression / Assessment and Plan / UC Course  ?I have reviewed the triage vital signs and the nursing notes. ? ?Pertinent labs & imaging results that were available during my care of the patient were reviewed by me and considered in my medical decision making (see chart for details). ? ?  ?Strep pharyngitis.  Rapid strep positive.  Treating with amoxicillin.  Tylenol or ibuprofen as needed for fever or discomfort.  Instructed patient to follow-up with her PCP if her symptoms are not improving.  She agrees to plan of care. ? ?Final Clinical Impressions(s) / UC Diagnoses  ? ?Final diagnoses:  ?Strep pharyngitis  ? ? ? ?Discharge Instructions   ? ?  ?You have strep throat.  Take the amoxicillin as directed.  Follow up with your primary care provider if your symptoms are not improving.   ? ? ? ? ? ?ED Prescriptions   ? ? Medication Sig Dispense Auth. Provider  ? amoxicillin (AMOXIL) 500 MG capsule Take 1 capsule (500 mg total) by mouth 2 (two) times daily for 10 days. 20 capsule Sharion Balloon, NP  ? ?  ? ?PDMP not reviewed  this encounter. ?  ?Sharion Balloon, NP ?02/03/22 1904 ? ?

## 2022-06-11 ENCOUNTER — Ambulatory Visit
Admission: EM | Admit: 2022-06-11 | Discharge: 2022-06-11 | Disposition: A | Discharge status: Home / Self Care | Payer: Managed Care, Other (non HMO) | Attending: Emergency Medicine | Admitting: Emergency Medicine

## 2022-06-11 DIAGNOSIS — H60392 Other infective otitis externa, left ear: Secondary | ICD-10-CM | POA: Diagnosis not present

## 2022-06-11 MED ORDER — OFLOXACIN 0.3 % OT SOLN: 10.0000 [drp] | Freq: Every day | OTIC | 0 refills | Status: AC

## 2022-06-11 NOTE — ED Provider Notes (Signed)
Kristie Bowman    CSN: [id number] Arrival date & time: 06/11/22  1816      History   Chief Complaint Chief Complaint  Patient presents with   Otalgia    HPI Kristie Bowman is a 61 y.o. female.   Patient presents with your throat, left-sided ear pain radiating into the jaw, cough, nasal congestion and rhinorrhea beginning 2 days ago.  Throat has resolved.  Known sick contact in household.  Tolerating food and liquids.  Has attempted use of over-the-counter analgesics which have been minimally helpful.  Denies fever, chills, body aches shortness of breath, wheezing.  History of diabetes, hypertension, hyperlipidemia.  Past Medical History:  Diagnosis Date   Cancer [facility])    left breast s/p left lumpectomy   Diabetes mellitus without complication ([place])    Hypertension     Patient Active Problem List   Diagnosis Date Noted   Swelling of foot joint, right 12/19/2020   Vitamin D deficiency 10/26/2020   Encounter for general adult medical examination with abnormal findings 06/12/2020   Dysuria 06/12/2020   Pneumonia due to COVID-19 virus 11/02/2019   Closed displaced oblique fracture of shaft of right humerus 08/31/2019   Intractable vomiting 01/17/2019   Acute intractable headache 01/17/2019   Uncontrolled type 2 diabetes mellitus with hyperglycemia ([place]) 07/18/2018   Needs flu shot 07/18/2018   Screening for colon cancer 07/18/2018   Type 2 diabetes mellitus with mild nonproliferative diabetic retinopathy with macular edema, bilateral ([place]) 10/15/2017   Essential (primary) hypertension 10/15/2017   Patient's noncompliance with other medical treatment and regimen 10/15/2017   Frequent diarrhea 10/15/2017   Other fatigue 10/15/2017   Type 2 diabetes mellitus with hyperglycemia ([place]) 10/15/2017   Personal history of malignant neoplasm of breast 10/15/2017   Low back pain 10/15/2017   Hyperlipidemia 10/15/2017    Past Surgical History:  Procedure Laterality Date    BREAST SURGERY  2010   left breast tumor removal    OB History   No obstetric history on file.      Home Medications    Prior to Admission medications   Medication Sig Start Date End Date Taking? Authorizing Provider  ofloxacin (FLOXIN) 0.3 % OTIC solution Place 10 drops into the left ear daily. 06/11/22  Yes Hans Eden, NP  Accu-Chek FastClix Lancets MISC Use as directed  Twice day E11.65 08/28/19   Ronnell Freshwater, NP  Blood Glucose Monitoring Suppl (ACCU-CHEK GUIDE ME) w/Device KIT Check blood sugar three times daily 11/18/20   Lavera Guise, MD  Calcium Carb-Cholecalciferol (CALCIUM 500/VITAMIN D) 500-3.125 MG-MCG TABS Take 1 tablet by mouth daily.    [provider]  ergocalciferol (DRISDOL) 1.25 MG (50000 UT) capsule Take 1 capsule (50,000 Units total) by mouth once a week. 09/25/20   Ronnell Freshwater, NP  glucose blood (ACCU-CHEK GUIDE) test strip Blood sugar testing TID - dx E11.65 08/30/19   Ronnell Freshwater, NP  ibuprofen (ADVIL) 800 MG tablet Take 1 tablet (800 mg total) by mouth every 8 (eight) hours as needed. 09/05/19   Leandrew Koyanagi, MD  insulin glargine, 2 Unit Dial, (TOUJEO MAX SOLOSTAR) 300 UNIT/ML Solostar Pen Inject up to 20 units [place] QD Patient taking differently: 12 Units daily. Inject up to 20 units [place] QD 07/18/21   Lavera Guise, MD  Insulin Pen Needle (FIFTY50 PEN NEEDLES) 32G X 4 MM MISC Use with Toujeo and victoza injections. Will need 2 needles per day. E11.65 04/19/20  Ronnell Freshwater, NP  liraglutide (VICTOZA) 18 MG/3ML SOPN Inject 1.8 mg into the skin daily. Please add to patient's med list. 08/15/21   McDonough, Si Gaul, PA-C  rosuvastatin (CRESTOR) 10 MG tablet Take 1 tablet (10 mg total) by mouth at bedtime. 09/25/20   Ronnell Freshwater, NP  timolol (TIMOPTIC) 0.5 % ophthalmic solution PLACE 1 DROP INTO BOTH EYES EVERY MORNING 01/06/17   [provider]    Family History Family History  Problem Relation Age of Onset    Hyperlipidemia Mother    Dementia Mother    Diabetes Brother    Diabetes Maternal Grandmother    Cancer Paternal Grandmother     Social History Social History   Tobacco Use   Smoking status: Former    Packs/day: 1.00    Years: 25.00    Total pack years: 25.00    Types: Cigarettes    Quit date: 10/19/2006    Years since quitting: 15.6   Smokeless tobacco: Never  Vaping Use   Vaping Use: Never used  Substance Use Topics   Alcohol use: Not Currently    Comment: ocassionally   Drug use: No     Allergies   Codeine   Review of Systems Review of Systems Defer to HPI    Physical Exam Triage Vital Signs ED Triage Vitals  Enc Vitals Group     BP 06/11/22 1833 112/80     Pulse Rate 06/11/22 1833 81     Resp 06/11/22 1833 18     Temp 06/11/22 1833 98.3 F (36.8 C)     Temp src --      SpO2 06/11/22 1833 98 %     Weight --      Height --      Head Circumference --      Peak Flow --      Pain Score 06/11/22 1831 5     Pain Loc --      Pain Edu? --      Excl. in [place]? --    No data found.  Updated Vital Signs BP 112/80   Pulse 81   Temp 98.3 F (36.8 C)   Resp 18   SpO2 98%   Visual Acuity Right Eye Distance:   Left Eye Distance:   Bilateral Distance:    Right Eye Near:   Left Eye Near:    Bilateral Near:     Physical Exam Constitutional:      Appearance: Normal appearance.  HENT:     Head: Normocephalic.     Right Ear: Tympanic membrane, ear canal and external ear normal.     Ears:     Comments: Abnormalities present to the tympanic membrane, moderate swelling with Kristie Bowman puslike drainage noted within the ear canal, no abnormalities to the external ear    Nose: Congestion and rhinorrhea present.     Mouth/Throat:     Mouth: Mucous membranes are moist.     Pharynx: Oropharynx is clear.  Eyes:     Extraocular Movements: Extraocular movements intact.  Cardiovascular:     Rate and Rhythm: Normal rate and regular rhythm.     Pulses: Normal pulses.      Heart sounds: Normal heart sounds.  Pulmonary:     Effort: Pulmonary effort is normal.     Breath sounds: Normal breath sounds.  Skin:    General: Skin is warm and dry.  Neurological:     Mental Status: She is alert and oriented to  person, place, and time. Mental status is at baseline.  Psychiatric:        Mood and Affect: Mood normal.        Behavior: Behavior normal.      UC Treatments / Results  Labs (all labs ordered are listed, but only abnormal results are displayed) Labs Reviewed - No data to display  EKG   Radiology No results found.  Procedures Procedures (including critical care time)  Medications Ordered in UC Medications - No data to display  Initial Impression / Assessment and Plan / UC Course  I have reviewed the triage vital signs and the nursing notes.  Pertinent labs & imaging results that were available during my care of the patient were reviewed by me and considered in my medical decision making (see chart for details).  Otitis externa of left ear  Presentation is consistent with outer ear infection, discussed with patient, ofloxacin prescribed and discussed administration, advised against any ear cleaning or swimming until symptoms have resolved and medication is complete, may continue use of over-the-counter analgesics as well as warm compresses to the external ear, may use over-the-counter medications for management of additional symptoms, given strict precautions that if improvement not seen within 72 hours to follow-up with urgent care for reevaluation Final Clinical Impressions(s) / UC Diagnoses   Final diagnoses:  Infective otitis externa of left ear     Discharge Instructions      Today you are being treated for an infection canal, on exam there is swelling and [place]us Stay drainage present  Place 10 drops into the left ear every morning for 7 days  You may use Tylenol or ibuprofen for management of discomfort  May hold warm compresses  to the ear for additional comfort  Please not attempted any ear cleaning or object or fluid placement into the ear canal to prevent further irritation  Please avoid swimming until symptoms are clear and medication is complete  If you do not see any improvement after 3 days of using medicine please return for reevaluation as you may need pills for treatment   For cough: honey 1/2 to 1 teaspoon (you can dilute the honey in water or another fluid).  You can also use guaifenesin and dextromethorphan for cough. You can use a humidifier for chest congestion and cough.  If you don't have a humidifier, you can sit in the bathroom with the hot shower running.      For congestion: take a daily anti-histamine like Zyrtec, Claritin, and a oral decongestant, such as pseudoephedrine.  You can also use Flonase 1-2 sprays in each nostril daily.   It is important to stay hydrated: drink plenty of fluids (water, gatorade/powerade/pedialyte, juices, or teas) to keep your throat moisturized and help further relieve irritation/discomfort.     ED Prescriptions     Medication Sig Dispense Auth. Provider   ofloxacin (FLOXIN) 0.3 % OTIC solution Place 10 drops into the left ear daily. 5 mL Hans Eden, NP      PDMP not reviewed this encounter.   Hans Eden, NP 06/11/22 1926

## 2022-06-11 NOTE — Discharge Instructions (Signed)
Today you are being treated for an infection canal, on exam there is swelling and Rethel Sebek drainage present  Place 10 drops into the left ear every morning for 7 days  You may use Tylenol or ibuprofen for management of discomfort  May hold warm compresses to the ear for additional comfort  Please not attempted any ear cleaning or object or fluid placement into the ear canal to prevent further irritation  Please avoid swimming until symptoms are clear and medication is complete  If you do not see any improvement after 3 days of using medicine please return for reevaluation as you may need pills for treatment   For cough: honey 1/2 to 1 teaspoon (you can dilute the honey in water or another fluid).  You can also use guaifenesin and dextromethorphan for cough. You can use a humidifier for chest congestion and cough.  If you don't have a humidifier, you can sit in the bathroom with the hot shower running.      For congestion: take a daily anti-histamine like Zyrtec, Claritin, and a oral decongestant, such as pseudoephedrine.  You can also use Flonase 1-2 sprays in each nostril daily.   It is important to stay hydrated: drink plenty of fluids (water, gatorade/powerade/pedialyte, juices, or teas) to keep your throat moisturized and help further relieve irritation/discomfort.

## 2022-06-11 NOTE — ED Triage Notes (Signed)
Pt presents with left side ear and jaw pain that began a few days ago

## 2022-06-16 ENCOUNTER — Other Ambulatory Visit: Payer: Self-pay

## 2022-06-16 ENCOUNTER — Ambulatory Visit
Admission: EM | Admit: 2022-06-16 | Discharge: 2022-06-16 | Disposition: A | Discharge status: Home / Self Care | Payer: Managed Care, Other (non HMO) | Attending: Family Medicine | Admitting: Family Medicine

## 2022-06-16 ENCOUNTER — Encounter: Payer: Self-pay | Admitting: Emergency Medicine

## 2022-06-16 DIAGNOSIS — J029 Acute pharyngitis, unspecified: Secondary | ICD-10-CM | POA: Diagnosis not present

## 2022-06-16 DIAGNOSIS — H66003 Acute suppurative otitis media without spontaneous rupture of ear drum, bilateral: Secondary | ICD-10-CM | POA: Diagnosis not present

## 2022-06-16 LAB — POCT RAPID STREP A (OFFICE): Rapid Strep A Screen: NEGATIVE

## 2022-06-16 MED ORDER — FLUCONAZOLE 150 MG PO TABS: 150.0000 mg | ORAL_TABLET | ORAL | 0 refills | Status: AC | PRN

## 2022-06-16 MED ORDER — AMOXICILLIN-POT CLAVULANATE 875-125 MG PO TABS: 1.0000 | ORAL_TABLET | Freq: Two times a day (BID) | ORAL | 0 refills | Status: DC

## 2022-06-16 NOTE — ED Provider Notes (Signed)
[name]    CSN: [id number] Arrival date & time: 06/16/22  1754      History   Chief Complaint Chief Complaint  Patient presents with   Nasal Congestion   Ear Fullness    HPI Kristie Bowman is a 61 y.o. female.   HPI Patient seen here 3 days ago and treated for an ear infection with topical antibiotics.  Patient reports her left ear worsened and her right ear now was experiencing pain.  Patient is status post COVID-19 virus which she had in the latter part of July and extended into the early part of August.  She reports severe upper respiratory symptoms which took approximately 3 weeks to completely resolve.  She continues to have some residual congestion and developed sore throat only a few days ago.  She has not had a fever.  She administered the topical antibiotic eardrops as prescribed without any relief.  Past Medical History:  Diagnosis Date   Cancer [facility])    left breast s/p left lumpectomy   Diabetes mellitus without complication ([place])    Hypertension     Patient Active Problem List   Diagnosis Date Noted   Swelling of foot joint, right 12/19/2020   Vitamin D deficiency 10/26/2020   Encounter for general adult medical examination with abnormal findings 06/12/2020   Dysuria 06/12/2020   Pneumonia due to COVID-19 virus 11/02/2019   Closed displaced oblique fracture of shaft of right humerus 08/31/2019   Intractable vomiting 01/17/2019   Acute intractable headache 01/17/2019   Uncontrolled type 2 diabetes mellitus with hyperglycemia ([place]) 07/18/2018   Needs flu shot 07/18/2018   Screening for colon cancer 07/18/2018   Type 2 diabetes mellitus with mild nonproliferative diabetic retinopathy with macular edema, bilateral ([place]) 10/15/2017   Essential (primary) hypertension 10/15/2017   Patient's noncompliance with other medical treatment and regimen 10/15/2017   Frequent diarrhea 10/15/2017   Other fatigue 10/15/2017   Type 2 diabetes mellitus with  hyperglycemia ([place]) 10/15/2017   Personal history of malignant neoplasm of breast 10/15/2017   Low back pain 10/15/2017   Hyperlipidemia 10/15/2017    Past Surgical History:  Procedure Laterality Date   BREAST SURGERY  2010   left breast tumor removal    OB History   No obstetric history on file.      Home Medications    Prior to Admission medications   Medication Sig Start Date End Date Taking? Authorizing Provider  amoxicillin-clavulanate (AUGMENTIN) 875-125 MG tablet Take 1 tablet by mouth every 12 (twelve) hours. 06/16/22  Yes Scot Jun, FNP  Calcium Carb-Cholecalciferol (CALCIUM 500/VITAMIN D) 500-3.125 MG-MCG TABS Take 1 tablet by mouth daily.   Yes [provider]  ergocalciferol (DRISDOL) 1.25 MG (50000 UT) capsule Take 1 capsule (50,000 Units total) by mouth once a week. 09/25/20  Yes Boscia, Greer Ee, NP  fluconazole (DIFLUCAN) 150 MG tablet Take 1 tablet (150 mg total) by mouth every three (3) days as needed. Repeat if needed 06/16/22  Yes Scot Jun, FNP  insulin glargine, 2 Unit Dial, (TOUJEO MAX SOLOSTAR) 300 UNIT/ML Solostar Pen Inject up to 20 units [place] QD Patient taking differently: 12 Units daily. Inject up to 20 units [place] QD 07/18/21  Yes Lavera Guise, MD  liraglutide (VICTOZA) 18 MG/3ML SOPN Inject 1.8 mg into the skin daily. Please add to patient's med list. 08/15/21  Yes McDonough, Lauren K, PA-C  rosuvastatin (CRESTOR) 10 MG tablet Take 1 tablet (10 mg total) by mouth  at bedtime. 09/25/20  Yes Boscia, Heather E, NP  timolol (TIMOPTIC) 0.5 % ophthalmic solution PLACE 1 DROP INTO BOTH EYES EVERY MORNING 01/06/17  Yes [provider]  Accu-Chek FastClix Lancets MISC Use as directed  Twice day E11.65 08/28/19   Ronnell Freshwater, NP  Blood Glucose Monitoring Suppl (ACCU-CHEK GUIDE ME) w/Device KIT Check blood sugar three times daily 11/18/20   Lavera Guise, MD  glucose blood (ACCU-CHEK GUIDE) test strip Blood sugar testing TID - dx  E11.65 08/30/19   Ronnell Freshwater, NP  ibuprofen (ADVIL) 800 MG tablet Take 1 tablet (800 mg total) by mouth every 8 (eight) hours as needed. 09/05/19   Leandrew Koyanagi, MD  Insulin Pen Needle (FIFTY50 PEN NEEDLES) 32G X 4 MM MISC Use with Toujeo and victoza injections. Will need 2 needles per day. E11.65 04/19/20   Ronnell Freshwater, NP  ofloxacin (FLOXIN) 0.3 % OTIC solution Place 10 drops into the left ear daily. 06/11/22   Hans Eden, NP    Family History Family History  Problem Relation Age of Onset   Hyperlipidemia Mother    Dementia Mother    Diabetes Brother    Diabetes Maternal Grandmother    Cancer Paternal Grandmother     Social History Social History   Tobacco Use   Smoking status: Former    Packs/day: 1.00    Years: 25.00    Total pack years: 25.00    Types: Cigarettes    Quit date: 10/19/2006    Years since quitting: 15.6   Smokeless tobacco: Never  Vaping Use   Vaping Use: Never used  Substance Use Topics   Alcohol use: Not Currently    Comment: ocassionally   Drug use: No     Allergies   Codeine  Review of Systems Review of Systems Pertinent negatives listed in HPI   Physical Exam Triage Vital Signs ED Triage Vitals  Enc Vitals Group     BP 06/16/22 1909 117/80     Pulse Rate 06/16/22 1909 86     Resp 06/16/22 1909 12     Temp 06/16/22 1909 97.9 F (36.6 C)     Temp Source 06/16/22 1909 Oral     SpO2 06/16/22 1909 98 %     Weight --      Height --      Head Circumference --      Peak Flow --      Pain Score 06/16/22 1910 8     Pain Loc --      Pain Edu? --      Excl. in [place]? --    No data found.  Updated Vital Signs BP 117/80 (BP Location: Right Arm)   Pulse 86   Temp 97.9 F (36.6 C) (Oral)   Resp 12   SpO2 98%   Visual Acuity Right Eye Distance:   Left Eye Distance:   Bilateral Distance:    Right Eye Near:   Left Eye Near:    Bilateral Near:     Physical Exam Vitals and nursing note reviewed.  Constitutional:       General: She is not in acute distress.    Appearance: Normal appearance. She is ill-appearing. She is not toxic-appearing.  HENT:     Head: Normocephalic and atraumatic.     Right Ear: Swelling and tenderness present. Tympanic membrane is erythematous.     Left Ear: Swelling and tenderness present. Tympanic membrane is erythematous.  Eyes:  Extraocular Movements: Extraocular movements intact.     Pupils: Pupils are equal, round, and reactive to light.  Cardiovascular:     Rate and Rhythm: Normal rate and regular rhythm.  Pulmonary:     Effort: Pulmonary effort is normal.     Breath sounds: Normal breath sounds.  Skin:    General: Skin is warm and dry.     Capillary Refill: Capillary refill takes less than 2 seconds.  Neurological:     General: No focal deficit present.     Mental Status: She is alert and oriented to person, place, and time.  Psychiatric:        Attention and Perception: Attention normal.        Mood and Affect: Mood normal.        Behavior: Behavior normal.    UC Treatments / Results  Labs (all labs ordered are listed, but only abnormal results are displayed) Labs Reviewed  POCT RAPID STREP A (OFFICE)    EKG   Radiology No results found.  Procedures Procedures (including critical care time)  Medications Ordered in UC Medications - No data to display  Initial Impression / Assessment and Plan / UC Course  I have reviewed the triage vital signs and the nursing notes.  Pertinent labs & imaging results that were available during my care of the patient were reviewed by me and considered in my medical decision making (see chart for details).    Non-Recurrent Acute Otitis of both ear without spontaneous  Acute Pharyngitis  Rapid Strep negative. Recently recovered from [place] the early part of August therefore did not repeat COVID or flu test. Treating for a acute otitis media involving both the ears with Augmentin twice daily for 7 days.  Patient  advised to discontinue the ofloxacin ear drops.  Diflucan prescribed for prophylaxis against antibiotic-induced candidiasis. Final Clinical Impressions(s) / UC Diagnoses   Final diagnoses:  Non-recurrent acute suppurative otitis media of both ears without spontaneous rupture of tympanic membranes  Acute pharyngitis, unspecified etiology     Discharge Instructions      Rapid strep was negative meaning you do not have strep throat..   I am treating for bilateral ear infection.  I do suspect that this may be residual from your previous COVID-19 infection causing some of your symptoms.   Take Augmentin as prescribed.  Tylenol or ibuprofen as needed for acute pain.  You can discontinue the eardrops.  I have also prescribed you Diflucan to prevent yeast associated with Augmentin antibiotic treatment.   ED Prescriptions     Medication Sig Dispense Auth. Provider   amoxicillin-clavulanate (AUGMENTIN) 875-125 MG tablet Take 1 tablet by mouth every 12 (twelve) hours. 14 tablet Scot Jun, FNP   fluconazole (DIFLUCAN) 150 MG tablet Take 1 tablet (150 mg total) by mouth every three (3) days as needed. Repeat if needed 2 tablet Scot Jun, FNP      PDMP not reviewed this encounter.   Scot Jun, FNP 06/16/22 2025

## 2022-06-16 NOTE — Discharge Instructions (Addendum)
Rapid strep was negative meaning you do not have strep throat..   I am treating for bilateral ear infection.  I do suspect that this may be residual from your previous COVID-19 infection causing some of your symptoms.   Take Augmentin as prescribed.  Tylenol or ibuprofen as needed for acute pain.  You can discontinue the eardrops.  I have also prescribed you Diflucan to prevent yeast associated with Augmentin antibiotic treatment.

## 2022-06-16 NOTE — ED Triage Notes (Signed)
Patient c/o nasal congestion and bilateral ear pressure x 1 week.   Patient endorses worsening sinus pressure.   Patient endorses painful swallowing.   Patient endorses difficulty hearing.   Patient presented with ear pain last week in clinic.   Patient has taken Mucinex with no relief of symptoms.

## 2022-07-31 ENCOUNTER — Encounter: Payer: Managed Care, Other (non HMO) | Admitting: Physician Assistant

## 2022-08-24 ENCOUNTER — Encounter: Payer: Managed Care, Other (non HMO) | Admitting: Physician Assistant

## 2022-09-21 ENCOUNTER — Ambulatory Visit (INDEPENDENT_AMBULATORY_CARE_PROVIDER_SITE_OTHER): Payer: Managed Care, Other (non HMO) | Admitting: Physician Assistant

## 2022-09-21 ENCOUNTER — Encounter: Payer: Self-pay | Admitting: Physician Assistant

## 2022-09-21 VITALS — BP 121/76 | HR 76 | Temp 97.6°F | Resp 16 | Ht 66.0 in | Wt 150.4 lb

## 2022-09-21 DIAGNOSIS — E538 Deficiency of other specified B group vitamins: Secondary | ICD-10-CM

## 2022-09-21 DIAGNOSIS — Z794 Long term (current) use of insulin: Secondary | ICD-10-CM

## 2022-09-21 DIAGNOSIS — E559 Vitamin D deficiency, unspecified: Secondary | ICD-10-CM

## 2022-09-21 DIAGNOSIS — Z0001 Encounter for general adult medical examination with abnormal findings: Secondary | ICD-10-CM | POA: Diagnosis not present

## 2022-09-21 DIAGNOSIS — E785 Hyperlipidemia, unspecified: Secondary | ICD-10-CM | POA: Diagnosis not present

## 2022-09-21 DIAGNOSIS — Z853 Personal history of malignant neoplasm of breast: Secondary | ICD-10-CM

## 2022-09-21 DIAGNOSIS — E1165 Type 2 diabetes mellitus with hyperglycemia: Secondary | ICD-10-CM | POA: Diagnosis not present

## 2022-09-21 DIAGNOSIS — R5383 Other fatigue: Secondary | ICD-10-CM

## 2022-09-21 DIAGNOSIS — Z1231 Encounter for screening mammogram for malignant neoplasm of breast: Secondary | ICD-10-CM

## 2022-09-21 DIAGNOSIS — R300 Dysuria: Secondary | ICD-10-CM

## 2022-09-21 NOTE — Progress Notes (Signed)
[facility] [place], [place] [address]  Internal MEDICINE  Office Visit Note  Patient Name: Kristie Bowman  [id number]  [id number]  Date of Service: 09/30/2022  Chief Complaint  Patient presents with   Annual Exam   Hypertension   Quality Metric Gaps    Shingles and TDAP     HPI Pt is here for routine health maintenance examination -52units of insulin, and is doing better, followed by endocrinology, does take a tablet and thinks it might be jardiance -declines shingles shot, sister was hospitalized after shingles vaccine and denies ever having chicken pox -last tdap less than 10 years ago -Foot exam done at endo -Never did colon screening and will need to reconsider this. Also due for pap and will schedule future appt for this. -Due for mammogram -Former smoker, stopped 15 years ago, smoked about 1/2 ppd 20 years. Denies any SOB or breathing concerns -due for routine labs  Current Medication: Outpatient Encounter Medications as of 09/21/2022  Medication Sig   Accu-Chek FastClix Lancets MISC Use as directed  Twice day E11.65   amoxicillin-clavulanate (AUGMENTIN) 875-125 MG tablet Take 1 tablet by mouth every 12 (twelve) hours.   Blood Glucose Monitoring Suppl (ACCU-CHEK GUIDE ME) w/Device KIT Check blood sugar three times daily   Calcium Carb-Cholecalciferol (CALCIUM 500/VITAMIN D) 500-3.125 MG-MCG TABS Take 1 tablet by mouth daily.   ergocalciferol (DRISDOL) 1.25 MG (50000 UT) capsule Take 1 capsule (50,000 Units total) by mouth once a week.   fluconazole (DIFLUCAN) 150 MG tablet Take 1 tablet (150 mg total) by mouth every three (3) days as needed. Repeat if needed   glucose blood (ACCU-CHEK GUIDE) test strip Blood sugar testing TID - dx E11.65   ibuprofen (ADVIL) 800 MG tablet Take 1 tablet (800 mg total) by mouth every 8 (eight) hours as needed.   insulin glargine, 2 Unit Dial, (TOUJEO MAX SOLOSTAR) 300 UNIT/ML Solostar Pen Inject up to 20 units  [place] QD (Patient taking differently: 12 Units daily. Inject up to 20 units [place] QD)   Insulin Pen Needle (FIFTY50 PEN NEEDLES) 32G X 4 MM MISC Use with Toujeo and victoza injections. Will need 2 needles per day. E11.65   liraglutide (VICTOZA) 18 MG/3ML SOPN Inject 1.8 mg into the skin daily. Please add to patient's med list.   ofloxacin (FLOXIN) 0.3 % OTIC solution Place 10 drops into the left ear daily.   rosuvastatin (CRESTOR) 10 MG tablet Take 1 tablet (10 mg total) by mouth at bedtime.   timolol (TIMOPTIC) 0.5 % ophthalmic solution PLACE 1 DROP INTO BOTH EYES EVERY MORNING   No facility-administered encounter medications on file as of 09/21/2022.    Surgical History: Past Surgical History:  Procedure Laterality Date   BREAST SURGERY  2010   left breast tumor removal    Medical History: Past Medical History:  Diagnosis Date   Cancer ([place])    left breast s/p left lumpectomy   Diabetes mellitus without complication ([place])    Hypertension     Family History: Family History  Problem Relation Age of Onset   Hyperlipidemia Mother    Dementia Mother    Diabetes Brother    Diabetes Maternal Grandmother    Cancer Paternal Grandmother       Review of Systems  Constitutional:  Negative for chills, diaphoresis and fatigue.  HENT:  Negative for ear pain, postnasal drip and sinus pressure.   Eyes:  Negative for photophobia, discharge, redness, itching and visual disturbance.  Respiratory:  Negative for cough, shortness of breath and wheezing.   Cardiovascular:  Negative for chest pain, palpitations and leg swelling.  Gastrointestinal:  Negative for abdominal pain, constipation, diarrhea, nausea and vomiting.  Genitourinary:  Negative for dysuria and flank pain.  Musculoskeletal:  Negative for arthralgias, back pain, gait problem and neck pain.  Skin:  Negative for color change.  Allergic/Immunologic: Negative for environmental allergies and food allergies.  Neurological:  Negative for  dizziness and headaches.  Hematological:  Does not bruise/bleed easily.  Psychiatric/Behavioral:  Negative for agitation, behavioral problems (depression) and hallucinations.      Vital Signs: BP 121/76   Pulse 76   Temp 97.6 F (36.4 C)   Resp 16   Wt 150 lb 6.4 oz (68.2 kg)   SpO2 99%   BMI 24.28 kg/m    Physical Exam Vitals and nursing note reviewed.  Constitutional:      General: She is not in acute distress.    Appearance: She is well-developed. She is not diaphoretic.  HENT:     Head: Normocephalic and atraumatic.     Mouth/Throat:     Pharynx: No oropharyngeal exudate.  Eyes:     Pupils: Pupils are equal, round, and reactive to light.  Neck:     Thyroid: No thyromegaly.     Vascular: No JVD.     Trachea: No tracheal deviation.  Cardiovascular:     Rate and Rhythm: Normal rate and regular rhythm.     Heart sounds: Normal heart sounds. No murmur heard.    No friction rub. No gallop.  Pulmonary:     Effort: Pulmonary effort is normal. No respiratory distress.     Breath sounds: No wheezing or rales.  Chest:     Chest wall: No tenderness.  Abdominal:     General: Bowel sounds are normal.     Palpations: Abdomen is soft.     Tenderness: There is no abdominal tenderness.  Musculoskeletal:        General: Normal range of motion.     Cervical back: Normal range of motion and neck supple.  Lymphadenopathy:     Cervical: No cervical adenopathy.  Skin:    General: Skin is warm and dry.  Neurological:     Mental Status: She is alert and oriented to person, place, and time.     Cranial Nerves: No cranial nerve deficit.  Psychiatric:        Behavior: Behavior normal.        Thought Content: Thought content normal.        Judgment: Judgment normal.      LABS: No results found for this or any previous visit (from the past 2160 hour(s)).      Assessment/Plan: 1. Encounter for general adult medical examination with abnormal findings CPE performed, routine  fasting labs ordered. Due for mammogram, pap, and colon screening--declines at this time  2. Type 2 diabetes mellitus with hyperglycemia, with long-term current use of insulin ([place]) Followed by endocrinology  3. Hyperlipidemia, unspecified hyperlipidemia type Continue crestor and will update labs - Lipid Panel With LDL/HDL Ratio  4. B12 deficiency - B12 and Folate Panel  5. Vitamin D deficiency - VITAMIN D 25 Hydroxy (Vit-D Deficiency, Fractures)  6. Screening mammogram for breast cancer Will schedule mammogram  7. Personal history of malignant neoplasm of breast Previously followed at [facility] and told she could go back to regular screenings  8. Other fatigue - CBC w/Diff/Platelet - Comprehensive metabolic panel -  TSH + free T4 - Lipid Panel With LDL/HDL Ratio - B12 and Folate Panel - VITAMIN D 25 Hydroxy (Vit-D Deficiency, Fractures)  9. Dysuria - UA/M w/rflx Culture, Routine   General Counseling: Marijo verbalizes understanding of the findings of todays visit and agrees with plan of treatment. I have discussed any further diagnostic evaluation that may be needed or ordered today. We also reviewed her medications today. she has been encouraged to call the office with any questions or concerns that should arise related to todays visit.    Counseling:    Orders Placed This Encounter  Procedures   UA/M w/rflx Culture, Routine   CBC w/Diff/Platelet   Comprehensive metabolic panel   TSH + free T4   Lipid Panel With LDL/HDL Ratio   B12 and Folate Panel   VITAMIN D 25 Hydroxy (Vit-D Deficiency, Fractures)    No orders of the defined types were placed in this encounter.   This patient was seen by Drema Dallas, PA-C in collaboration with Dr. Clayborn Bigness as a part of collaborative care agreement.  Total time spent:35 Minutes  Time spent includes review of chart, medications, test results, and follow up plan with the patient.     Lavera Guise, MD  Internal  Medicine

## 2022-10-29 ENCOUNTER — Ambulatory Visit: Payer: Managed Care, Other (non HMO) | Admitting: Physician Assistant

## 2023-05-19 ENCOUNTER — Other Ambulatory Visit: Payer: Self-pay

## 2023-05-19 DIAGNOSIS — Z4800 Encounter for change or removal of nonsurgical wound dressing: Secondary | ICD-10-CM | POA: Insufficient documentation

## 2023-05-19 DIAGNOSIS — Z5321 Procedure and treatment not carried out due to patient leaving prior to being seen by health care provider: Secondary | ICD-10-CM | POA: Insufficient documentation

## 2023-05-19 NOTE — ED Triage Notes (Signed)
Pt presents to ER with c/o needing wound check.  Pt states she had a fall on 7/24, causing an abrasion to her right elbow.  Pt states she has been "doctoring" on it at home, and is concerned about possible infection.  Pts right elbow has scab noted.  No extensive redness, warmth or swelling noted.  Pt otherwise A&O x4 and in NAD in triage.

## 2023-05-20 ENCOUNTER — Emergency Department
Admission: EM | Admit: 2023-05-20 | Discharge: 2023-05-20 | Discharge status: Against Medical Advice | Payer: Managed Care, Other (non HMO) | Attending: Emergency Medicine | Admitting: Emergency Medicine

## 2023-05-20 NOTE — ED Notes (Signed)
No answer when called several times from lobby 

## 2023-07-02 ENCOUNTER — Inpatient Hospital Stay
Admission: RE | Admit: 2023-07-02 | Discharge: 2023-07-02 | Disposition: A | Discharge status: Home / Self Care | Payer: Self-pay | Source: Ambulatory Visit | Attending: Physician Assistant | Admitting: Physician Assistant

## 2023-07-02 ENCOUNTER — Other Ambulatory Visit: Payer: Self-pay | Admitting: *Deleted

## 2023-07-02 DIAGNOSIS — Z1231 Encounter for screening mammogram for malignant neoplasm of breast: Secondary | ICD-10-CM

## 2023-07-07 ENCOUNTER — Encounter: Payer: Self-pay | Admitting: Physician Assistant

## 2023-07-07 ENCOUNTER — Other Ambulatory Visit: Payer: Self-pay | Admitting: Physician Assistant

## 2023-07-07 DIAGNOSIS — Z1231 Encounter for screening mammogram for malignant neoplasm of breast: Secondary | ICD-10-CM

## 2023-08-18 ENCOUNTER — Ambulatory Visit
Admission: RE | Admit: 2023-08-18 | Discharge: 2023-08-18 | Disposition: A | Discharge status: Home / Self Care | Payer: Managed Care, Other (non HMO) | Source: Ambulatory Visit | Attending: Physician Assistant | Admitting: Physician Assistant

## 2023-08-18 DIAGNOSIS — Z1231 Encounter for screening mammogram for malignant neoplasm of breast: Secondary | ICD-10-CM | POA: Insufficient documentation

## 2023-08-18 HISTORY — DX: Personal history of irradiation: Z92.3

## 2023-08-18 HISTORY — DX: Personal history of antineoplastic chemotherapy: Z92.21

## 2023-09-06 ENCOUNTER — Telehealth: Payer: Self-pay | Admitting: Physician Assistant

## 2023-09-06 ENCOUNTER — Encounter: Payer: Self-pay | Admitting: Physician Assistant

## 2023-09-06 ENCOUNTER — Ambulatory Visit (INDEPENDENT_AMBULATORY_CARE_PROVIDER_SITE_OTHER): Payer: Managed Care, Other (non HMO) | Admitting: Physician Assistant

## 2023-09-06 VITALS — BP 112/70 | HR 78 | Temp 97.9°F | Resp 16 | Ht 66.0 in | Wt 156.0 lb

## 2023-09-06 DIAGNOSIS — Z1329 Encounter for screening for other suspected endocrine disorder: Secondary | ICD-10-CM

## 2023-09-06 DIAGNOSIS — E1165 Type 2 diabetes mellitus with hyperglycemia: Secondary | ICD-10-CM | POA: Diagnosis not present

## 2023-09-06 DIAGNOSIS — Z113 Encounter for screening for infections with a predominantly sexual mode of transmission: Secondary | ICD-10-CM | POA: Diagnosis not present

## 2023-09-06 DIAGNOSIS — Z124 Encounter for screening for malignant neoplasm of cervix: Secondary | ICD-10-CM

## 2023-09-06 DIAGNOSIS — E559 Vitamin D deficiency, unspecified: Secondary | ICD-10-CM

## 2023-09-06 DIAGNOSIS — R5383 Other fatigue: Secondary | ICD-10-CM

## 2023-09-06 DIAGNOSIS — E785 Hyperlipidemia, unspecified: Secondary | ICD-10-CM | POA: Diagnosis not present

## 2023-09-06 DIAGNOSIS — E538 Deficiency of other specified B group vitamins: Secondary | ICD-10-CM

## 2023-09-06 DIAGNOSIS — Z794 Long term (current) use of insulin: Secondary | ICD-10-CM

## 2023-09-06 NOTE — Telephone Encounter (Signed)
Awaiting 09/06/23 office notes for Endocrinology referral-Toni

## 2023-09-06 NOTE — Progress Notes (Signed)
[facility] [address] [place], [place] [address]  Internal MEDICINE  Office Visit Note  Patient Name: Kristie Bowman  [id number]  [id number]  Date of Service: 09/13/2023  Chief Complaint  Patient presents with   Follow-up   Diabetes   Hypertension    HPI Pt is here for routine follow up -Needs new endo referral, previous endo retired. States she has enough of her medications for now -due for pap and will complete today -left eye cataract removal recently and is doing some drops. Left ear muffled, no pain--complete earwax blockage and will try ear wax remover to help. Goes back to eye doctor this week. Other eye on Dec1 -due for labs prior to CPE  Current Medication: Outpatient Encounter Medications as of 09/06/2023  Medication Sig   Accu-Chek FastClix Lancets MISC Use as directed  Twice day E11.65   amoxicillin-clavulanate (AUGMENTIN) 875-125 MG tablet Take 1 tablet by mouth every 12 (twelve) hours.   Blood Glucose Monitoring Suppl (ACCU-CHEK GUIDE ME) w/Device KIT Check blood sugar three times daily   Calcium Carb-Cholecalciferol (CALCIUM 500/VITAMIN D) 500-3.125 MG-MCG TABS Take 1 tablet by mouth daily.   ergocalciferol (DRISDOL) 1.25 MG (50000 UT) capsule Take 1 capsule (50,000 Units total) by mouth once a week.   fluconazole (DIFLUCAN) 150 MG tablet Take 1 tablet (150 mg total) by mouth every three (3) days as needed. Repeat if needed   glucose blood (ACCU-CHEK GUIDE) test strip Blood sugar testing TID - dx E11.65   ibuprofen (ADVIL) 800 MG tablet Take 1 tablet (800 mg total) by mouth every 8 (eight) hours as needed.   insulin glargine, 2 Unit Dial, (TOUJEO MAX SOLOSTAR) 300 UNIT/ML Solostar Pen Inject up to 20 units [place] QD (Patient taking differently: 12 Units daily. Inject up to 20 units [place] QD)   Insulin Pen Needle (FIFTY50 PEN NEEDLES) 32G X 4 MM MISC Use with Toujeo and victoza injections. Will need 2 needles per day. E11.65   liraglutide (VICTOZA) 18  MG/3ML SOPN Inject 1.8 mg into the skin daily. Please add to patient's med list.   ofloxacin (FLOXIN) 0.3 % OTIC solution Place 10 drops into the left ear daily.   rosuvastatin (CRESTOR) 10 MG tablet Take 1 tablet (10 mg total) by mouth at bedtime.   timolol (TIMOPTIC) 0.5 % ophthalmic solution PLACE 1 DROP INTO BOTH EYES EVERY MORNING   No facility-administered encounter medications on file as of 09/06/2023.    Surgical History: Past Surgical History:  Procedure Laterality Date   BREAST LUMPECTOMY Left 2009   BREAST SURGERY  2010   left breast tumor removal    Medical History: Past Medical History:  Diagnosis Date   Cancer (HCC)    left breast s/p left lumpectomy   Diabetes mellitus without complication (HCC)    Hypertension    Personal history of chemotherapy    Personal history of radiation therapy     Family History: Family History  Problem Relation Age of Onset   Hyperlipidemia Mother    Dementia Mother    Diabetes Brother    Diabetes Maternal Grandmother    Cancer Paternal Grandmother     Social History   Socioeconomic History   Marital status: Widowed    Spouse name: Not on file   Number of children: Not on file   Years of education: Not on file   Highest education level: Not on file  Occupational History   Not on file  Tobacco Use   Smoking status:  Former    Current packs/day: 0.00    Average packs/day: 1 pack/day for 25.0 years (25.0 ttl pk-yrs)    Types: Cigarettes    Start date: 10/19/1981    Quit date: 10/19/2006    Years since quitting: 16.9   Smokeless tobacco: Never  Vaping Use   Vaping status: Never Used  Substance and Sexual Activity   Alcohol use: Not Currently    Comment: ocassionally   Drug use: No   Sexual activity: Never  Other Topics Concern   Not on file  Social History Narrative   Not on file   Social Determinants of Health   Financial Resource Strain: Not on file  Food Insecurity: Not on file  Transportation Needs: Not on  file  Physical Activity: Not on file  Stress: Not on file  Social Connections: Not on file  Intimate Partner Violence: Not on file      Review of Systems  Constitutional:  Negative for chills, diaphoresis and fatigue.  HENT:  Negative for ear discharge, ear pain, postnasal drip and sinus pressure.        Muffled hearing on left  Eyes:  Negative for photophobia, discharge, redness, itching and visual disturbance.  Respiratory:  Negative for cough, shortness of breath and wheezing.   Cardiovascular:  Negative for chest pain, palpitations and leg swelling.  Gastrointestinal:  Negative for abdominal pain, constipation, diarrhea, nausea and vomiting.  Genitourinary:  Negative for dysuria and flank pain.  Musculoskeletal:  Negative for arthralgias, back pain, gait problem and neck pain.  Skin:  Negative for color change.  Allergic/Immunologic: Negative for environmental allergies and food allergies.  Neurological:  Negative for dizziness and headaches.  Hematological:  Does not bruise/bleed easily.  Psychiatric/Behavioral:  Negative for agitation, behavioral problems (depression) and hallucinations.     Vital Signs: BP 112/70   Pulse 78   Temp 97.9 F (36.6 C)   Resp 16   Ht 5\' 6"  (1.676 m)   Wt 156 lb (70.8 kg)   SpO2 99%   BMI 25.18 kg/m    Physical Exam Vitals and nursing note reviewed. Exam conducted with a chaperone present.  Constitutional:      General: She is not in acute distress.    Appearance: She is well-developed. She is not diaphoretic.  HENT:     Head: Normocephalic and atraumatic.     Left Ear: There is impacted cerumen.     Mouth/Throat:     Pharynx: No oropharyngeal exudate.  Eyes:     Pupils: Pupils are equal, round, and reactive to light.  Neck:     Thyroid: No thyromegaly.     Vascular: No JVD.     Trachea: No tracheal deviation.  Cardiovascular:     Rate and Rhythm: Normal rate and regular rhythm.     Heart sounds: Normal heart sounds. No  murmur heard.    No friction rub. No gallop.  Pulmonary:     Effort: Pulmonary effort is normal. No respiratory distress.     Breath sounds: No wheezing or rales.  Chest:     Chest wall: No tenderness.  Abdominal:     General: Bowel sounds are normal.     Palpations: Abdomen is soft.  Genitourinary:    Cervix: Normal.     Comments: Pap performed Musculoskeletal:        General: Normal range of motion.     Cervical back: Normal range of motion and neck supple.  Lymphadenopathy:     Cervical:  No cervical adenopathy.  Skin:    General: Skin is warm and dry.  Neurological:     Mental Status: She is alert and oriented to person, place, and time.     Cranial Nerves: No cranial nerve deficit.  Psychiatric:        Behavior: Behavior normal.        Thought Content: Thought content normal.        Judgment: Judgment normal.        Assessment/Plan: 1. Type 2 diabetes mellitus with hyperglycemia, with long-term current use of insulin (HCC) Will send new referral for endocrinology due to previous provider retiring - Ambulatory referral to Endocrinology  2. Routine cervical smear - IGP, Aptima HPV  3. Screening for STDs (sexually transmitted diseases) - NuSwab Vaginitis Plus (VG+)  4. Hyperlipidemia, unspecified hyperlipidemia type - Lipid Panel With LDL/HDL Ratio  5. B12 deficiency - B12 and Folate Panel  6. Vitamin D deficiency - VITAMIN D 25 Hydroxy (Vit-D Deficiency, Fractures)  7. Thyroid disorder screening - TSH + free T4  8. Other fatigue - CBC w/Diff/Platelet - Comprehensive metabolic panel - TSH + free T4 - Lipid Panel With LDL/HDL Ratio - B12 and Folate Panel - VITAMIN D 25 Hydroxy (Vit-D Deficiency, Fractures)   General Counseling: Keilany verbalizes understanding of the findings of todays visit and agrees with plan of treatment. I have discussed any further diagnostic evaluation that may be needed or ordered today. We also reviewed her medications today. she  has been encouraged to call the office with any questions or concerns that should arise related to todays visit.    Orders Placed This Encounter  Procedures   CBC w/Diff/Platelet   Comprehensive metabolic panel   TSH + free T4   Lipid Panel With LDL/HDL Ratio   B12 and Folate Panel   VITAMIN D 25 Hydroxy (Vit-D Deficiency, Fractures)   NuSwab Vaginitis Plus (VG+)   Ambulatory referral to Endocrinology    No orders of the defined types were placed in this encounter.   This patient was seen by Lynn Ito, PA-C in collaboration with Dr. Beverely Risen as a part of collaborative care agreement.   Total time spent:30 Minutes Time spent includes review of chart, medications, test results, and follow up plan with the patient.      Dr Lyndon Code Internal medicine

## 2023-09-08 LAB — NUSWAB VAGINITIS PLUS (VG+)
Candida albicans, NAA: NEGATIVE
Candida glabrata, NAA: NEGATIVE
Chlamydia trachomatis, NAA: NEGATIVE
Neisseria gonorrhoeae, NAA: NEGATIVE
Trich vag by NAA: NEGATIVE

## 2023-09-10 LAB — IGP, APTIMA HPV: HPV Aptima: NEGATIVE

## 2023-09-13 ENCOUNTER — Telehealth: Payer: Self-pay

## 2023-09-13 NOTE — Telephone Encounter (Signed)
Spoke with patient regarding pap results.

## 2023-09-13 NOTE — Telephone Encounter (Signed)
-----   Message from Carlean Jews sent at 09/13/2023 12:58 PM EST ----- Please let her know that her pap came back normal and HPV negative

## 2023-09-14 ENCOUNTER — Telehealth: Payer: Self-pay | Admitting: Physician Assistant

## 2023-09-14 NOTE — Telephone Encounter (Signed)
Endocrinology referral faxed to [facility]; [phone]. Notified patient. Gave pt telephone [phone]

## 2023-09-24 LAB — CBC WITH DIFFERENTIAL/PLATELET
Basophils Absolute: 0.1 10*3/uL (ref 0.0–0.2)
Basos: 1 %
EOS (ABSOLUTE): 0.2 10*3/uL (ref 0.0–0.4)
Eos: 4 %
Hematocrit: 37.4 % (ref 34.0–46.6)
Hemoglobin: 12.6 g/dL (ref 11.1–15.9)
Immature Grans (Abs): 0 10*3/uL (ref 0.0–0.1)
Immature Granulocytes: 0 %
Lymphocytes Absolute: 1.3 10*3/uL (ref 0.7–3.1)
Lymphs: 22 %
MCH: 29.6 pg (ref 26.6–33.0)
MCHC: 33.7 g/dL (ref 31.5–35.7)
MCV: 88 fL (ref 79–97)
Monocytes Absolute: 0.6 10*3/uL (ref 0.1–0.9)
Monocytes: 11 %
Neutrophils Absolute: 3.8 10*3/uL (ref 1.4–7.0)
Neutrophils: 62 %
Platelets: 246 10*3/uL (ref 150–450)
RBC: 4.25 x10E6/uL (ref 3.77–5.28)
RDW: 12.5 % (ref 11.7–15.4)
WBC: 6 10*3/uL (ref 3.4–10.8)

## 2023-09-24 LAB — COMPREHENSIVE METABOLIC PANEL
ALT: 18 [IU]/L (ref 0–32)
AST: 17 [IU]/L (ref 0–40)
Albumin: 4 g/dL (ref 3.9–4.9)
Alkaline Phosphatase: 85 [IU]/L (ref 44–121)
BUN/Creatinine Ratio: 20 (ref 12–28)
BUN: 16 mg/dL (ref 8–27)
Bilirubin Total: 0.5 mg/dL (ref 0.0–1.2)
CO2: 24 mmol/L (ref 20–29)
Calcium: 9.3 mg/dL (ref 8.7–10.3)
Chloride: 104 mmol/L (ref 96–106)
Creatinine, Ser: 0.82 mg/dL (ref 0.57–1.00)
Globulin, Total: 2.5 g/dL (ref 1.5–4.5)
Glucose: 159 mg/dL — ABNORMAL HIGH (ref 70–99)
Potassium: 4 mmol/L (ref 3.5–5.2)
Sodium: 140 mmol/L (ref 134–144)
Total Protein: 6.5 g/dL (ref 6.0–8.5)
eGFR: 81 mL/min/{1.73_m2} (ref >59)

## 2023-09-24 LAB — LIPID PANEL WITH LDL/HDL RATIO
Cholesterol, Total: 290 mg/dL — ABNORMAL HIGH (ref 100–199)
HDL: 37 mg/dL — ABNORMAL LOW (ref >39)
LDL Chol Calc (NIH): 196 mg/dL — ABNORMAL HIGH (ref 0–99)
LDL/HDL Ratio: 5.3 {ratio} — ABNORMAL HIGH (ref 0.0–3.2)
Triglycerides: 286 mg/dL — ABNORMAL HIGH (ref 0–149)
VLDL Cholesterol Cal: 57 mg/dL — ABNORMAL HIGH (ref 5–40)

## 2023-09-24 LAB — TSH+FREE T4
Free T4: 1.1 ng/dL (ref 0.82–1.77)
TSH: 5.51 u[IU]/mL — ABNORMAL HIGH (ref 0.450–4.500)

## 2023-09-24 LAB — B12 AND FOLATE PANEL
Folate: 13.7 ng/mL (ref >3.0)
Vitamin B-12: 304 pg/mL (ref 232–1245)

## 2023-09-24 LAB — VITAMIN D 25 HYDROXY (VIT D DEFICIENCY, FRACTURES): Vit D, 25-Hydroxy: 13.1 ng/mL — ABNORMAL LOW (ref 30.0–100.0)

## 2023-09-27 ENCOUNTER — Ambulatory Visit (INDEPENDENT_AMBULATORY_CARE_PROVIDER_SITE_OTHER): Payer: Managed Care, Other (non HMO) | Admitting: Physician Assistant

## 2023-09-27 ENCOUNTER — Encounter: Payer: Self-pay | Admitting: Physician Assistant

## 2023-09-27 VITALS — BP 103/70 | HR 83 | Temp 98.3°F | Resp 16 | Ht 66.0 in | Wt 152.0 lb

## 2023-09-27 DIAGNOSIS — E559 Vitamin D deficiency, unspecified: Secondary | ICD-10-CM | POA: Diagnosis not present

## 2023-09-27 DIAGNOSIS — Z1212 Encounter for screening for malignant neoplasm of rectum: Secondary | ICD-10-CM

## 2023-09-27 DIAGNOSIS — Z1211 Encounter for screening for malignant neoplasm of colon: Secondary | ICD-10-CM

## 2023-09-27 DIAGNOSIS — E1165 Type 2 diabetes mellitus with hyperglycemia: Secondary | ICD-10-CM

## 2023-09-27 DIAGNOSIS — Z0001 Encounter for general adult medical examination with abnormal findings: Secondary | ICD-10-CM | POA: Diagnosis not present

## 2023-09-27 DIAGNOSIS — K529 Noninfective gastroenteritis and colitis, unspecified: Secondary | ICD-10-CM

## 2023-09-27 DIAGNOSIS — E782 Mixed hyperlipidemia: Secondary | ICD-10-CM

## 2023-09-27 DIAGNOSIS — R300 Dysuria: Secondary | ICD-10-CM

## 2023-09-27 DIAGNOSIS — R7989 Other specified abnormal findings of blood chemistry: Secondary | ICD-10-CM

## 2023-09-27 DIAGNOSIS — Z794 Long term (current) use of insulin: Secondary | ICD-10-CM

## 2023-09-27 MED ORDER — ERGOCALCIFEROL 1.25 MG (50000 UT) PO CAPS: 50000.0000 [IU] | ORAL_CAPSULE | ORAL | 2 refills | Status: AC

## 2023-09-27 MED ORDER — ROSUVASTATIN CALCIUM 5 MG PO TABS: 5.0000 mg | ORAL_TABLET | Freq: Every day | ORAL | 3 refills | Status: AC

## 2023-09-27 NOTE — Progress Notes (Signed)
[facility] [address] [place], [place] [address]  Internal MEDICINE  Office Visit Note  Patient Name: Kristie Bowman  [id number]  [id number]  Date of Service: 10/05/2023  Chief Complaint  Patient presents with   Annual Exam   Diabetes   Hypertension     HPI Pt is here for routine health maintenance examination -Labs reviewed: Vit D low and will start drisdol, TSH a little high and will monitor, HLD--not taking crestor--will restart, b12 a little low and will try oral supplement -had mammogram -Has cataract surgery on Wednesday -foot exam done by endo, has also seen podiatry as well. Has not heard from new endo yet, since previous retired and will follow up on this -food typically goes straight through her, has BM right after eating. No abdominal pain, no blood or dark colored stools. Thinks certain foods may make things worse and may have underlying food allergy/intolerance. Will refer to GI for further evaluation and pt will try to pinpoint any triggers. Also due for colonoscopy--never went in 2019  Current Medication: Outpatient Encounter Medications as of 09/27/2023  Medication Sig   Accu-Chek FastClix Lancets MISC Use as directed  Twice day E11.65   amoxicillin-clavulanate (AUGMENTIN) 875-125 MG tablet Take 1 tablet by mouth every 12 (twelve) hours.   Blood Glucose Monitoring Suppl (ACCU-CHEK GUIDE ME) w/Device KIT Check blood sugar three times daily   Calcium Carb-Cholecalciferol (CALCIUM 500/VITAMIN D) 500-3.125 MG-MCG TABS Take 1 tablet by mouth daily.   fluconazole (DIFLUCAN) 150 MG tablet Take 1 tablet (150 mg total) by mouth every three (3) days as needed. Repeat if needed   glucose blood (ACCU-CHEK GUIDE) test strip Blood sugar testing TID - dx E11.65   ibuprofen (ADVIL) 800 MG tablet Take 1 tablet (800 mg total) by mouth every 8 (eight) hours as needed.   insulin glargine, 2 Unit Dial, (TOUJEO MAX SOLOSTAR) 300 UNIT/ML Solostar Pen Inject up to 20  units [place] QD (Patient taking differently: 12 Units daily. Inject up to 20 units [place] QD)   Insulin Pen Needle (FIFTY50 PEN NEEDLES) 32G X 4 MM MISC Use with Toujeo and victoza injections. Will need 2 needles per day. E11.65   liraglutide (VICTOZA) 18 MG/3ML SOPN Inject 1.8 mg into the skin daily. Please add to patient's med list.   ofloxacin (FLOXIN) 0.3 % OTIC solution Place 10 drops into the left ear daily.   rosuvastatin (CRESTOR) 5 MG tablet Take 1 tablet (5 mg total) by mouth daily.   timolol (TIMOPTIC) 0.5 % ophthalmic solution PLACE 1 DROP INTO BOTH EYES EVERY MORNING   [DISCONTINUED] ergocalciferol (DRISDOL) 1.25 MG (50000 UT) capsule Take 1 capsule (50,000 Units total) by mouth once a week.   [DISCONTINUED] rosuvastatin (CRESTOR) 10 MG tablet Take 1 tablet (10 mg total) by mouth at bedtime.   ergocalciferol (DRISDOL) 1.25 MG (50000 UT) capsule Take 1 capsule (50,000 Units total) by mouth once a week.   No facility-administered encounter medications on file as of 09/27/2023.    Surgical History: Past Surgical History:  Procedure Laterality Date   BREAST LUMPECTOMY Left 2009   BREAST SURGERY  2010   left breast tumor removal    Medical History: Past Medical History:  Diagnosis Date   Cancer (HCC)    left breast s/p left lumpectomy   Diabetes mellitus without complication (HCC)    Hypertension    Personal history of chemotherapy    Personal history of radiation therapy     Family History: Family History  Problem Relation Age of Onset   Hyperlipidemia Mother    Dementia Mother    Diabetes Brother    Diabetes Maternal Grandmother    Cancer Paternal Grandmother       Review of Systems  Constitutional:  Negative for chills, diaphoresis and fatigue.  HENT:  Negative for ear pain, postnasal drip and sinus pressure.   Eyes:  Negative for photophobia, discharge, redness, itching and visual disturbance.  Respiratory:  Negative for cough, shortness of breath and wheezing.    Cardiovascular:  Negative for chest pain, palpitations and leg swelling.  Gastrointestinal:  Negative for abdominal pain, blood in stool, constipation, nausea and vomiting.  Genitourinary:  Negative for dysuria and flank pain.  Musculoskeletal:  Negative for arthralgias, back pain, gait problem and neck pain.  Skin:  Negative for color change.  Allergic/Immunologic: Negative for environmental allergies and food allergies.  Neurological:  Negative for dizziness and headaches.  Hematological:  Does not bruise/bleed easily.  Psychiatric/Behavioral:  Negative for agitation, behavioral problems (depression) and hallucinations.      Vital Signs: BP 103/70   Pulse 83   Temp 98.3 F (36.8 C)   Resp 16   Ht 5\' 6"  (1.676 m)   Wt 152 lb (68.9 kg)   SpO2 99%   BMI 24.53 kg/m    Physical Exam Vitals and nursing note reviewed.  Constitutional:      General: She is not in acute distress.    Appearance: She is well-developed. She is not diaphoretic.  HENT:     Head: Normocephalic and atraumatic.     Mouth/Throat:     Pharynx: No oropharyngeal exudate.  Eyes:     Pupils: Pupils are equal, round, and reactive to light.  Neck:     Thyroid: No thyromegaly.     Vascular: No JVD.     Trachea: No tracheal deviation.  Cardiovascular:     Rate and Rhythm: Normal rate and regular rhythm.     Heart sounds: Normal heart sounds. No murmur heard.    No friction rub. No gallop.  Pulmonary:     Effort: Pulmonary effort is normal. No respiratory distress.     Breath sounds: No wheezing or rales.  Chest:     Chest wall: No tenderness.  Abdominal:     General: Bowel sounds are normal.     Palpations: Abdomen is soft.     Tenderness: There is no abdominal tenderness.  Musculoskeletal:        General: Normal range of motion.     Cervical back: Normal range of motion and neck supple.  Lymphadenopathy:     Cervical: No cervical adenopathy.  Skin:    General: Skin is warm and dry.   Neurological:     Mental Status: She is alert and oriented to person, place, and time.     Cranial Nerves: No cranial nerve deficit.  Psychiatric:        Behavior: Behavior normal.        Thought Content: Thought content normal.        Judgment: Judgment normal.      LABS: Recent Results (from the past 2160 hours)  IGP, Aptima HPV     Status: None   Collection Time: 09/06/23  4:00 PM  Result Value Ref Range   Interpretation NILM     Comment: NEGATIVE FOR INTRAEPITHELIAL LESION OR MALIGNANCY.   Category NIL     Comment: Negative for Intraepithelial Lesion   Adequacy SDER  Comment: Satisfactory for evaluation.   Clinician Provided ICD10 Comment     Comment: Z12.4   Performed by: Comment     Comment: Park Liter, Cytotechnologist (ASCP)   QC reviewed by: Comment     Comment: Rogue Jury, Cytotechnologist (ASCP)   Note: Comment     Comment: The Pap smear is a screening test designed to aid in the detection of premalignant and malignant conditions of the uterine cervix.  It is not a diagnostic procedure and should not be used as the sole means of detecting cervical cancer.  Both false-positive and false-negative reports do occur.    Test Methodology Comment     Comment: This liquid based ThinPrep(R) pap test was screened with the use of an image guided system.    HPV Aptima Negative Negative    Comment: This nucleic acid amplification test detects fourteen high-risk HPV types (16,18,31,33,35,39,45,51,52,56,58,59,66,68) without differentiation.   NuSwab Vaginitis Plus (VG+)     Status: None   Collection Time: 09/06/23  4:00 PM  Result Value Ref Range   Atopobium vaginae Low - 0 Score   BVAB 2 Low - 0 Score   Megasphaera 1 Low - 0 Score    Comment: Calculate total score by adding the 3 individual bacterial vaginosis (BV) marker scores together.  Total score is interpreted as follows: Total score 0-1: Indicates the absence of BV. Total score   2:  Indeterminate for BV. Additional clinical                  data should be evaluated to establish a                  diagnosis. Total score 3-6: Indicates the presence of BV.    Candida albicans, NAA Negative Negative   Candida glabrata, NAA Negative Negative   Trich vag by NAA Negative Negative   Chlamydia trachomatis, NAA Negative Negative   Neisseria gonorrhoeae, NAA Negative Negative  CBC w/Diff/Platelet     Status: None   Collection Time: 09/23/23  8:35 AM  Result Value Ref Range   WBC 6.0 3.4 - 10.8 x10E3/uL   RBC 4.25 3.77 - 5.28 x10E6/uL   Hemoglobin 12.6 11.1 - 15.9 g/dL   Hematocrit 40.9 81.1 - 46.6 %   MCV 88 79 - 97 fL   MCH 29.6 26.6 - 33.0 pg   MCHC 33.7 31.5 - 35.7 g/dL   RDW 91.4 78.2 - 95.6 %   Platelets 246 150 - 450 x10E3/uL   Neutrophils 62 Not Estab. %   Lymphs 22 Not Estab. %   Monocytes 11 Not Estab. %   Eos 4 Not Estab. %   Basos 1 Not Estab. %   Neutrophils Absolute 3.8 1.4 - 7.0 x10E3/uL   Lymphocytes Absolute 1.3 0.7 - 3.1 x10E3/uL   Monocytes Absolute 0.6 0.1 - 0.9 x10E3/uL   EOS (ABSOLUTE) 0.2 0.0 - 0.4 x10E3/uL   Basophils Absolute 0.1 0.0 - 0.2 x10E3/uL   Immature Granulocytes 0 Not Estab. %   Immature Grans (Abs) 0.0 0.0 - 0.1 x10E3/uL  Comprehensive metabolic panel     Status: Abnormal   Collection Time: 09/23/23  8:35 AM  Result Value Ref Range   Glucose 159 (H) 70 - 99 mg/dL   BUN 16 8 - 27 mg/dL   Creatinine, Ser 2.13 0.57 - 1.00 mg/dL   eGFR 81 >08 MV/HQI/6.96   BUN/Creatinine Ratio 20 12 - 28   Sodium 140 134 - 144 mmol/L  Potassium 4.0 3.5 - 5.2 mmol/L   Chloride 104 96 - 106 mmol/L   CO2 24 20 - 29 mmol/L   Calcium 9.3 8.7 - 10.3 mg/dL   Total Protein 6.5 6.0 - 8.5 g/dL   Albumin 4.0 3.9 - 4.9 g/dL   Globulin, Total 2.5 1.5 - 4.5 g/dL   Bilirubin Total 0.5 0.0 - 1.2 mg/dL   Alkaline Phosphatase 85 44 - 121 IU/L   AST 17 0 - 40 IU/L   ALT 18 0 - 32 IU/L  TSH + free T4     Status: Abnormal   Collection Time: 09/23/23  8:35  AM  Result Value Ref Range   TSH 5.510 (H) 0.450 - 4.500 uIU/mL   Free T4 1.10 0.82 - 1.77 ng/dL  Lipid Panel With LDL/HDL Ratio     Status: Abnormal   Collection Time: 09/23/23  8:35 AM  Result Value Ref Range   Cholesterol, Total 290 (H) 100 - 199 mg/dL   Triglycerides 161 (H) 0 - 149 mg/dL   HDL 37 (L) >09 mg/dL   VLDL Cholesterol Cal 57 (H) 5 - 40 mg/dL   LDL Chol Calc (NIH) 604 (H) 0 - 99 mg/dL   LDL CALC COMMENT: Comment     Comment: Consider evaluating for Familial Hypercholesterolemia(FH), if clinically indicated.    LDL/HDL Ratio 5.3 (H) 0.0 - 3.2 ratio    Comment:                                     LDL/HDL Ratio                                             Men  Women                               1/2 Avg.Risk  1.0    1.5                                   Avg.Risk  3.6    3.2                                2X Avg.Risk  6.2    5.0                                3X Avg.Risk  8.0    6.1   B12 and Folate Panel     Status: None   Collection Time: 09/23/23  8:35 AM  Result Value Ref Range   Vitamin B-12 304 232 - 1,245 pg/mL   Folate 13.7 >3.0 ng/mL    Comment: A serum folate concentration of less than 3.1 ng/mL is considered to represent clinical deficiency.   VITAMIN D 25 Hydroxy (Vit-D Deficiency, Fractures)     Status: Abnormal   Collection Time: 09/23/23  8:35 AM  Result Value Ref Range   Vit D, 25-Hydroxy 13.1 (L) 30.0 - 100.0 ng/mL    Comment: Vitamin D deficiency has been defined by the Institute of Medicine and an Endocrine Society  practice guideline as a level of serum 25-OH vitamin D less than 20 ng/mL (1,2). The Endocrine Society went on to further define vitamin D insufficiency as a level between 21 and 29 ng/mL (2). 1. IOM (Institute of Medicine). 2010. Dietary reference    intakes for calcium and D. [place]: The    [employer]. 2. Holick MF, Binkley Olean, Bischoff-Ferrari HA, et al.    Evaluation, treatment, and prevention of vitamin D     deficiency: an Endocrine Society clinical practice    guideline. JCEM. 2011 Jul; 96(7):1911-30.         Assessment/Plan: 1. Encounter for general adult medical examination with abnormal findings (Primary) Cpe performed, labs reviewed, due for colonoscopy  2. Type 2 diabetes mellitus with hyperglycemia, with long-term current use of insulin (HCC) Will follow up on new endo referral, pt reports she has enough of her medications currently  - Urine Microalbumin w/creat. ratio  3. Vitamin D deficiency - ergocalciferol (DRISDOL) 1.25 MG (50000 UT) capsule; Take 1 capsule (50,000 Units total) by mouth once a week.  Dispense: 12 capsule; Refill: 2  4. Mixed hyperlipidemia Will restart on crestor - rosuvastatin (CRESTOR) 5 MG tablet; Take 1 tablet (5 mg total) by mouth daily.  Dispense: 90 tablet; Refill: 3  5. Encounter for colorectal cancer screening - Ambulatory referral to Gastroenterology  6. Postprandial diarrhea Will refer to GI due to change in BM, advised pt to try monitor for any pattern/food triggers - Ambulatory referral to Gastroenterology  7. TSH elevation - TSH+T4F+T3Free - Anti-TPO Ab (RDL) - Thyroid antibodies  8. Dysuria - UA/M w/rflx Culture, Routine   General Counseling: Kila verbalizes understanding of the findings of todays visit and agrees with plan of treatment. I have discussed any further diagnostic evaluation that may be needed or ordered today. We also reviewed her medications today. she has been encouraged to call the office with any questions or concerns that should arise related to todays visit.    Counseling:    Orders Placed This Encounter  Procedures   UA/M w/rflx Culture, Routine   Urine Microalbumin w/creat. ratio   TSH+T4F+T3Free   Anti-TPO Ab (RDL)   Thyroid antibodies   Ambulatory referral to Gastroenterology    Meds ordered this encounter  Medications   ergocalciferol (DRISDOL) 1.25 MG (50000 UT) capsule    Sig: Take 1  capsule (50,000 Units total) by mouth once a week.    Dispense:  12 capsule    Refill:  2   rosuvastatin (CRESTOR) 5 MG tablet    Sig: Take 1 tablet (5 mg total) by mouth daily.    Dispense:  90 tablet    Refill:  3    This patient was seen by Lynn Ito, PA-C in collaboration with Dr. Beverely Risen as a part of collaborative care agreement.  Total time spent:35 Minutes  Time spent includes review of chart, medications, test results, and follow up plan with the patient.     Lyndon Code, MD  Internal Medicine

## 2023-10-04 ENCOUNTER — Telehealth: Payer: Self-pay | Admitting: Physician Assistant

## 2023-10-04 NOTE — Telephone Encounter (Signed)
Received call from patient. [facility] endocrinology stated they did not receive referral 09/14/23. I called their office, they told me same thing. I faxed to them again; [phone]. Notified patient-Kristie Bowman

## 2023-11-04 ENCOUNTER — Encounter (INDEPENDENT_AMBULATORY_CARE_PROVIDER_SITE_OTHER): Payer: Managed Care, Other (non HMO) | Admitting: Ophthalmology

## 2023-11-04 DIAGNOSIS — Z7984 Long term (current) use of oral hypoglycemic drugs: Secondary | ICD-10-CM

## 2023-11-04 DIAGNOSIS — H59031 Cystoid macular edema following cataract surgery, right eye: Secondary | ICD-10-CM

## 2023-11-04 DIAGNOSIS — Z794 Long term (current) use of insulin: Secondary | ICD-10-CM | POA: Diagnosis not present

## 2023-11-04 DIAGNOSIS — E113393 Type 2 diabetes mellitus with moderate nonproliferative diabetic retinopathy without macular edema, bilateral: Secondary | ICD-10-CM

## 2023-11-04 DIAGNOSIS — H43813 Vitreous degeneration, bilateral: Secondary | ICD-10-CM

## 2023-11-04 DIAGNOSIS — H35371 Puckering of macula, right eye: Secondary | ICD-10-CM | POA: Diagnosis not present

## 2023-11-04 DIAGNOSIS — H35033 Hypertensive retinopathy, bilateral: Secondary | ICD-10-CM

## 2023-11-08 ENCOUNTER — Other Ambulatory Visit: Payer: Self-pay

## 2023-11-10 ENCOUNTER — Encounter: Payer: Self-pay | Admitting: Physician Assistant

## 2023-11-10 ENCOUNTER — Ambulatory Visit: Payer: Managed Care, Other (non HMO) | Admitting: Physician Assistant

## 2023-11-10 VITALS — BP 113/75 | HR 87 | Temp 98.7°F | Ht 66.0 in | Wt 154.6 lb

## 2023-11-10 DIAGNOSIS — K529 Noninfective gastroenteritis and colitis, unspecified: Secondary | ICD-10-CM

## 2023-11-10 DIAGNOSIS — Z1211 Encounter for screening for malignant neoplasm of colon: Secondary | ICD-10-CM

## 2023-11-10 DIAGNOSIS — R197 Diarrhea, unspecified: Secondary | ICD-10-CM

## 2023-11-10 NOTE — Progress Notes (Signed)
Celso Amy, PA-C [address]  [address]  [place], [place] [address]  Main: [phone]  Fax: [phone]   Gastroenterology Consultation  Referring Provider:     Carlean Jews, PA* Primary Care Physician:  Carlean Jews, PA-C Primary Gastroenterologist:  Celso Amy, PA-C  Reason for Consultation:     Diarrhea        HPI:   Kristie Bowman is a 63 y.o. y/o female referred for consultation & management  by Carlean Jews, PA-C.    Patient states she has been having worsening diarrhea for the past 4 years.  Seemed to start after she was treated with chemo for breast cancer several years ago.  Has history of diabetes.  Takes metformin.  She has multiple watery bowel movements daily with episodes of nocturnal diarrhea.  Diarrhea is worse after eating greasy, fatty, or sugary foods.  She denies abdominal pain, nausea, vomiting, rectal bleeding, or weight loss.  Never had stool studies.  No previous EGD, Colonoscopy, or GI evaluation.  Past Medical History:  Diagnosis Date   BPPV (benign paroxysmal positional vertigo) 04/25/2012   BRCA negative 07/27/2013   07/20/13, BRCA1/2 negative via [facility] testing (sequencing and deletion/duplication testing)     Breast CA (HCC) 11/15/2012   1. Left breast cancer.   a. Presentation with palpable mass.   b. Ultrasound-guided core biopsy in office without clip placement 08/25/07.   i. Invasive ductal, grade 3.   ii. ER negative, PR negative, HER2/neu positive.   iii. Clinically negative axilla.   c. Breast MRI 09/21/07 with suspicious satellite lesions.   d. Second look ultrasound 09/27/07 with recommendation for MRI-   guided core biops   Breast cancer, left breast (HCC) 06/17/2012   Cancer (HCC)    left breast s/p left lumpectomy   Conductive hearing loss of both ears 04/25/2012   Diabetes mellitus without complication (HCC)    Eustachian tube dysfunction 04/25/2012   Hearing loss 03/15/2012   Hypertension     Personal history of chemotherapy    Personal history of radiation therapy    Psoriasis 11/18/2012   Rhinitis 03/15/2012   TM (tympanic membrane disorder) 03/15/2012    Past Surgical History:  Procedure Laterality Date   BREAST LUMPECTOMY Left 2009   BREAST SURGERY  2010   left breast tumor removal    Prior to Admission medications   Medication Sig Start Date End Date Taking? Authorizing Provider  Accu-Chek FastClix Lancets MISC Use as directed  Twice day E11.65 08/28/19   Carlean Jews, NP  Blood Glucose Monitoring Suppl (ACCU-CHEK GUIDE ME) w/Device KIT Check blood sugar three times daily 11/18/20   Lyndon Code, MD  brimonidine [facility]) 0.2 % ophthalmic solution Place 1 drop into the left eye 3 (three) times daily. 08/15/23   [provider]  Calcium Carb-Cholecalciferol (CALCIUM 500/VITAMIN D) 500-3.125 MG-MCG TABS Take 1 tablet by mouth daily.    [provider]  Continuous Glucose Receiver (DEXCOM G7 RECEIVER) DEVI Use 1 Device as directed 10/27/23   [provider]  Continuous Glucose Sensor (DEXCOM G7 SENSOR) MISC Use 1 each every 10 (ten) days 10/27/23   [provider]  Empagliflozin-metFORMIN HCl (SYNJARDY) 12.02-999 MG TABS Take 1 tablet by mouth every morning.    [provider]  ergocalciferol (DRISDOL) 1.25 MG (50000 UT) capsule Take 1 capsule (50,000 Units total) by mouth once a week. 09/27/23   McDonough, Salomon Fick, PA-C  fluconazole (DIFLUCAN) 150 MG tablet  Take 1 tablet (150 mg total) by mouth every three (3) days as needed. Repeat if needed 06/16/22   Bing Neighbors, NP  glucose blood (ACCU-CHEK GUIDE) test strip Blood sugar testing TID - dx E11.65 08/30/19   Carlean Jews, NP  ibuprofen (ADVIL) 800 MG tablet Take 1 tablet (800 mg total) by mouth every 8 (eight) hours as needed. 09/05/19   Tarry Kos, MD  insulin glargine, 2 Unit Dial, (TOUJEO MAX SOLOSTAR) 300 UNIT/ML Solostar Pen Inject up to 20 units [place] QD Patient  taking differently: 12 Units daily. Inject up to 20 units [place] QD 07/18/21   Lyndon Code, MD  Insulin Pen Needle (FIFTY50 PEN NEEDLES) 32G X 4 MM MISC Use with Toujeo and victoza injections. Will need 2 needles per day. E11.65 04/19/20   Carlean Jews, NP  ketorolac (ACULAR) 0.4 % SOLN Apply to eye. 09/23/23   [provider]  latanoprost (XALATAN) 0.005 % ophthalmic solution 1 drop at bedtime. 10/25/23   [provider]  liraglutide (VICTOZA) 18 MG/3ML SOPN Inject 1.8 mg into the skin daily. Please add to patient's med list. 08/15/21   McDonough, Salomon Fick, PA-C  ofloxacin (FLOXIN) 0.3 % OTIC solution Place 10 drops into the left ear daily. 06/11/22   White, Elita Boone, NP  rosuvastatin (CRESTOR) 5 MG tablet Take 1 tablet (5 mg total) by mouth daily. 09/27/23   McDonough, Salomon Fick, PA-C  timolol (TIMOPTIC) 0.5 % ophthalmic solution PLACE 1 DROP INTO BOTH EYES EVERY MORNING 01/06/17   [provider]    Family History  Problem Relation Age of Onset   Hyperlipidemia Mother    Dementia Mother    Diabetes Brother    Diabetes Maternal Grandmother    Cancer Paternal Grandmother      Social History   Tobacco Use   Smoking status: Former    Current packs/day: 0.00    Average packs/day: 1 pack/day for 25.0 years (25.0 ttl pk-yrs)    Types: Cigarettes    Start date: 10/19/1981    Quit date: 10/19/2006    Years since quitting: 17.0   Smokeless tobacco: Never  Vaping Use   Vaping status: Never Used  Substance Use Topics   Alcohol use: Not Currently    Comment: ocassionally   Drug use: No    Allergies as of 11/10/2023 - Review Complete 11/10/2023  Allergen Reaction Noted   Codeine Other (See Comments) 02/06/2014    Review of Systems:    All systems reviewed and negative except where noted in HPI.   Physical Exam:  BP (!) 145/81   Pulse 87   Temp 98.7 F (37.1 C)   Ht 5\' 6"  (1.676 m)   Wt 154 lb 9.6 oz (70.1 kg)   BMI 24.95 kg/m  No LMP recorded. Patient is  postmenopausal.  General:   Alert,  Well-developed, well-nourished, pleasant and cooperative in NAD Lungs:  Respirations even and unlabored.  Clear throughout to auscultation.   No wheezes, crackles, or rhonchi. No acute distress. Heart:  Regular rate and rhythm; no murmurs, clicks, rubs, or gallops. Abdomen:  Normal bowel sounds.  No bruits.  Soft, and non-distended without masses, hepatosplenomegaly or hernias noted.  No Tenderness.  No guarding or rebound tenderness.    Neurologic:  Alert and oriented x3;  grossly normal neurologically. Psych:  Alert and cooperative. Normal mood and affect.  Imaging Studies: No results found.  Assessment and Plan:   Kristie Bowman is a 63  y.o. y/o female has been referred for chronic diarrhea.  Differential includes infectious diarrhea, pancreatic insufficiency, celiac, microscopic colitis, adverse side effect of medication (metformin) and IBS.  I am ordering stool studies and labs to begin her evaluation.  Pending results, then we can decide about scheduling colonoscopy.  She has never had a colonoscopy and is overdue for colon cancer screening.  She has no alarm symptoms such as abdominal pain, rectal bleeding, or anemia.  Diarrhea  Stool Studies: GI Pathogen Panal, C. Difficile Toxin PCR, Fecal Calprotectin, fecal pancreatic elastase.  Lab: Celiac Panal  Colon Cancer Screening If stool studies are Negative, then Schedule Colonoscopy. She has never had a colonoscopy.  Follow up 4 weeks with TG.  Celso Amy, PA-C

## 2023-11-12 LAB — CELIAC DISEASE AB SCREEN W/RFX
Antigliadin Abs, IgA: 7 U (ref 0–19)
IgA/Immunoglobulin A, Serum: 441 mg/dL — ABNORMAL HIGH (ref 87–352)
Transglutaminase IgA: <2 U/mL (ref 0–3)

## 2023-11-16 ENCOUNTER — Encounter: Payer: Self-pay | Admitting: Physician Assistant

## 2023-11-20 LAB — CLOSTRIDIUM DIFFICILE BY PCR: Toxigenic C. Difficile by PCR: NEGATIVE

## 2023-11-21 LAB — GI PROFILE, STOOL, PCR

## 2023-11-21 LAB — PANCREATIC ELASTASE, FECAL: Pancreatic Elastase, Fecal: 420 ug Elast./g (ref >200)

## 2023-11-21 LAB — CALPROTECTIN, FECAL: Calprotectin, Fecal: 6 ug/g (ref 0–120)

## 2023-11-22 ENCOUNTER — Telehealth: Payer: Self-pay

## 2023-11-22 ENCOUNTER — Other Ambulatory Visit: Payer: Self-pay

## 2023-11-22 DIAGNOSIS — Z1211 Encounter for screening for malignant neoplasm of colon: Secondary | ICD-10-CM

## 2023-11-22 MED ORDER — NA SULFATE-K SULFATE-MG SULF 17.5-3.13-1.6 GM/177ML PO SOLN: 1.0000 | Freq: Once | ORAL | 0 refills | Status: AC

## 2023-11-22 NOTE — Telephone Encounter (Signed)
error 

## 2023-11-22 NOTE — Telephone Encounter (Signed)
Left message to call office---  Stool studies are all normal / Negative.  **Go ahead and schedule colonoscopy.  Diagnosis colon cancer screening, chronic diarrhea.  You can cancel follow-up OV with me 12/13/2023.  I recommend follow-up OV after she has colonoscopy done.  Celso Amy, PA-C

## 2023-12-13 ENCOUNTER — Ambulatory Visit: Payer: Managed Care, Other (non HMO) | Admitting: Physician Assistant

## 2023-12-13 NOTE — Progress Notes (Deleted)
 Celso Amy, PA-C [address]  [address]  [place], [place] [address]  Main: [phone]  Fax: [phone]   Primary Care Physician: Carlean Jews, PA-C  Primary Gastroenterologist:  Celso Amy, PA-C / Dr. Wyline Mood    CC:  F/U Chronic Diarrhea  HPI: Kristie Bowman is a 63 y.o. female returns for f/u chronic diarrhea ongoing for 4 years.  Diarrhea started after she was treated with chemo for breast cancer several years ago.  Currently on metformin.  11/18/23 Stool Studies: GI pathogen panel, C. difficile toxin, fecal calprotectin, fecal pancreatic elastase all normal/negative.  10/2023 Labs: Celiac panel negative. 09/2023 CBC, CMP, TSH normal except glucose 159.  Colonoscopy is scheduled for 12/23/23.  She has never had a colonoscopy.  Current Outpatient Medications  Medication Sig Dispense Refill   Accu-Chek FastClix Lancets MISC Use as directed  Twice day E11.65 100 each 3   Blood Glucose Monitoring Suppl (ACCU-CHEK GUIDE ME) w/Device KIT Check blood sugar three times daily 1 kit 0   brimonidine (ALPHAGAN) 0.2 % ophthalmic solution Place 1 drop into the left eye 3 (three) times daily.     Calcium Carb-Cholecalciferol (CALCIUM 500/VITAMIN D) 500-3.125 MG-MCG TABS Take 1 tablet by mouth daily.     Continuous Glucose Receiver (DEXCOM G7 RECEIVER) DEVI Use 1 Device as directed     Continuous Glucose Sensor (DEXCOM G7 SENSOR) MISC Use 1 each every 10 (ten) days     Empagliflozin-metFORMIN HCl (SYNJARDY) 12.02-999 MG TABS Take 1 tablet by mouth every morning.     ergocalciferol (DRISDOL) 1.25 MG (50000 UT) capsule Take 1 capsule (50,000 Units total) by mouth once a week. 12 capsule 2   fluconazole (DIFLUCAN) 150 MG tablet Take 1 tablet (150 mg total) by mouth every three (3) days as needed. Repeat if needed 2 tablet 0   glucose blood (ACCU-CHEK GUIDE) test strip Blood sugar testing TID - dx E11.65 100 each 11   ibuprofen (ADVIL) 800 MG tablet Take 1 tablet (800 mg  total) by mouth every 8 (eight) hours as needed. 30 tablet 2   insulin glargine, 2 Unit Dial, (TOUJEO MAX SOLOSTAR) 300 UNIT/ML Solostar Pen Inject up to 20 units [place] QD (Patient taking differently: 12 Units daily. Inject up to 20 units [place] QD) 9 mL 0   Insulin Pen Needle (FIFTY50 PEN NEEDLES) 32G X 4 MM MISC Use with Toujeo and victoza injections. Will need 2 needles per day. E11.65 60 each 3   ketorolac (ACULAR) 0.4 % SOLN Apply to eye.     latanoprost (XALATAN) 0.005 % ophthalmic solution 1 drop at bedtime.     liraglutide (VICTOZA) 18 MG/3ML SOPN Inject 1.8 mg into the skin daily. Please add to patient's med list. 5 mL 1   ofloxacin (FLOXIN) 0.3 % OTIC solution Place 10 drops into the left ear daily. 5 mL 0   rosuvastatin (CRESTOR) 5 MG tablet Take 1 tablet (5 mg total) by mouth daily. 90 tablet 3   timolol (TIMOPTIC) 0.5 % ophthalmic solution PLACE 1 DROP INTO BOTH EYES EVERY MORNING     No current facility-administered medications for this visit.    Allergies as of 12/13/2023 - Review Complete 11/10/2023  Allergen Reaction Noted   Codeine Other (See Comments) 02/06/2014    Past Medical History:  Diagnosis Date   BPPV (benign paroxysmal positional vertigo) 04/25/2012   BRCA negative 07/27/2013   07/20/13, BRCA1/2 negative via [facility] testing (sequencing and deletion/duplication testing)  Breast CA (HCC) 11/15/2012   1. Left breast cancer.   a. Presentation with palpable mass.   b. Ultrasound-guided core biopsy in office without clip placement 08/25/07.   i. Invasive ductal, grade 3.   ii. ER negative, PR negative, HER2/neu positive.   iii. Clinically negative axilla.   c. Breast MRI 09/21/07 with suspicious satellite lesions.   d. Second look ultrasound 09/27/07 with recommendation for MRI-   guided core biops   Breast cancer, left breast (HCC) 06/17/2012   Cancer (HCC)    left breast s/p left lumpectomy   Conductive hearing loss of both ears 04/25/2012   Diabetes mellitus without  complication (HCC)    Eustachian tube dysfunction 04/25/2012   Hearing loss 03/15/2012   Hypertension    Personal history of chemotherapy    Personal history of radiation therapy    Psoriasis 11/18/2012   Rhinitis 03/15/2012   TM (tympanic membrane disorder) 03/15/2012    Past Surgical History:  Procedure Laterality Date   BREAST LUMPECTOMY Left 2009   BREAST SURGERY  2010   left breast tumor removal    Review of Systems:    All systems reviewed and negative except where noted in HPI.   Physical Examination:   There were no vitals taken for this visit.  General: Well-nourished, well-developed in no acute distress.  Lungs: Clear to auscultation bilaterally. Non-labored. Heart: Regular rate and rhythm, no murmurs rubs or gallops.  Abdomen: Bowel sounds are normal; Abdomen is Soft; No hepatosplenomegaly, masses or hernias;  No Abdominal Tenderness; No guarding or rebound tenderness. Neuro: Alert and oriented x 3.  Grossly intact.  Psych: Alert and cooperative, normal mood and affect.   Imaging Studies: No results found.  Assessment and Plan:   Kristie Bowman is a 63 y.o. y/o female ***    Celso Amy, PA-C  Follow up ***  BP check ***

## 2023-12-23 ENCOUNTER — Ambulatory Visit
Admission: RE | Admit: 2023-12-23 | Payer: Managed Care, Other (non HMO) | Source: Home / Self Care | Admitting: Gastroenterology

## 2023-12-23 ENCOUNTER — Encounter: Admission: RE | Payer: Self-pay | Source: Home / Self Care

## 2023-12-23 LAB — TSH+T4F+T3FREE
Free T4: 1.14 ng/dL (ref 0.82–1.77)
T3, Free: 2.6 pg/mL (ref 2.0–4.4)
TSH: 1.62 u[IU]/mL (ref 0.450–4.500)

## 2023-12-23 LAB — THYROID ANTIBODIES (THYROPEROXIDASE & THYROGLOBULIN)
Thyroglobulin Antibody: <1 [IU]/mL (ref 0.0–0.9)
Thyroperoxidase Ab SerPl-aCnc: 15 [IU]/mL (ref 0–34)

## 2023-12-23 LAB — ANTI-TPO AB (RDL)

## 2023-12-23 SURGERY — COLONOSCOPY WITH PROPOFOL
Anesthesia: General

## 2023-12-27 ENCOUNTER — Encounter: Payer: Self-pay | Admitting: Physician Assistant

## 2023-12-27 ENCOUNTER — Ambulatory Visit (INDEPENDENT_AMBULATORY_CARE_PROVIDER_SITE_OTHER): Payer: Managed Care, Other (non HMO) | Admitting: Physician Assistant

## 2023-12-27 VITALS — BP 128/80 | HR 77 | Temp 98.4°F | Resp 16 | Ht 67.0 in | Wt 156.6 lb

## 2023-12-27 DIAGNOSIS — R7989 Other specified abnormal findings of blood chemistry: Secondary | ICD-10-CM

## 2023-12-27 DIAGNOSIS — K529 Noninfective gastroenteritis and colitis, unspecified: Secondary | ICD-10-CM

## 2023-12-27 DIAGNOSIS — Z1231 Encounter for screening mammogram for malignant neoplasm of breast: Secondary | ICD-10-CM | POA: Diagnosis not present

## 2023-12-27 DIAGNOSIS — Z1212 Encounter for screening for malignant neoplasm of rectum: Secondary | ICD-10-CM

## 2023-12-27 DIAGNOSIS — Z1211 Encounter for screening for malignant neoplasm of colon: Secondary | ICD-10-CM

## 2023-12-27 NOTE — Progress Notes (Signed)
 [facility] [address] [place], [place] [address]  Internal MEDICINE  Office Visit Note  Patient Name: Kristie Bowman  [id number]  [id number]  Date of Service: 01/08/2024  Chief Complaint  Patient presents with   Follow-up    Review labs   Diabetes   Hypertension    HPI Pt is here for routine follow up -Thyroid labs reviewed and were normal -GI did order labs, but states these came back ok. Was going to do a colonoscopy but decided against this. Wants to do cologuard instead. Did discuss colonoscopy would be beneficial in general given hx of postprandial diarrhea/GI sx, but pt still wants to do cologuard at this time and follow up with GI in office -a few weeks ago had a cold and now has lingering cough, did have 2 family members test positive for covid at that time, but did not test herself. No fever -coughing spells, but improving now -Endocrinologist following DM  Current Medication: Outpatient Encounter Medications as of 12/27/2023  Medication Sig   Accu-Chek FastClix Lancets MISC Use as directed  Twice day E11.65   Blood Glucose Monitoring Suppl (ACCU-CHEK GUIDE ME) w/Device KIT Check blood sugar three times daily   brimonidine (ALPHAGAN) 0.2 % ophthalmic solution Place 1 drop into the left eye 3 (three) times daily.   Calcium Carb-Cholecalciferol (CALCIUM 500/VITAMIN D) 500-3.125 MG-MCG TABS Take 1 tablet by mouth daily.   Continuous Glucose Receiver (DEXCOM G7 RECEIVER) DEVI Use 1 Device as directed   Continuous Glucose Sensor (DEXCOM G7 SENSOR) MISC Use 1 each every 10 (ten) days   Empagliflozin-metFORMIN HCl (SYNJARDY) 12.02-999 MG TABS Take 1 tablet by mouth every morning.   ergocalciferol (DRISDOL) 1.25 MG (50000 UT) capsule Take 1 capsule (50,000 Units total) by mouth once a week.   fluconazole (DIFLUCAN) 150 MG tablet Take 1 tablet (150 mg total) by mouth every three (3) days as needed. Repeat if needed   glucose blood (ACCU-CHEK GUIDE) test strip  Blood sugar testing TID - dx E11.65   ibuprofen (ADVIL) 800 MG tablet Take 1 tablet (800 mg total) by mouth every 8 (eight) hours as needed.   insulin glargine, 2 Unit Dial, (TOUJEO MAX SOLOSTAR) 300 UNIT/ML Solostar Pen Inject up to 20 units [place] QD (Patient taking differently: 12 Units daily. Inject up to 20 units [place] QD)   Insulin Pen Needle (FIFTY50 PEN NEEDLES) 32G X 4 MM MISC Use with Toujeo and victoza injections. Will need 2 needles per day. E11.65   ketorolac (ACULAR) 0.4 % SOLN Apply to eye.   latanoprost (XALATAN) 0.005 % ophthalmic solution 1 drop at bedtime.   liraglutide (VICTOZA) 18 MG/3ML SOPN Inject 1.8 mg into the skin daily. Please add to patient's med list.   ofloxacin (FLOXIN) 0.3 % OTIC solution Place 10 drops into the left ear daily.   rosuvastatin (CRESTOR) 5 MG tablet Take 1 tablet (5 mg total) by mouth daily.   timolol (TIMOPTIC) 0.5 % ophthalmic solution PLACE 1 DROP INTO BOTH EYES EVERY MORNING   No facility-administered encounter medications on file as of 12/27/2023.    Surgical History: Past Surgical History:  Procedure Laterality Date   BREAST LUMPECTOMY Left 2009   BREAST SURGERY  2010   left breast tumor removal    Medical History: Past Medical History:  Diagnosis Date   BPPV (benign paroxysmal positional vertigo) 04/25/2012   BRCA negative 07/27/2013   07/20/13, BRCA1/2 negative via [facility] testing (sequencing and deletion/duplication testing)     Breast  CA (HCC) 11/15/2012   1. Left breast cancer.   a. Presentation with palpable mass.   b. Ultrasound-guided core biopsy in office without clip placement 08/25/07.   i. Invasive ductal, grade 3.   ii. ER negative, PR negative, HER2/neu positive.   iii. Clinically negative axilla.   c. Breast MRI 09/21/07 with suspicious satellite lesions.   d. Second look ultrasound 09/27/07 with recommendation for MRI-   guided core biops   Breast cancer, left breast (HCC) 06/17/2012   Cancer (HCC)    left breast s/p left  lumpectomy   Conductive hearing loss of both ears 04/25/2012   Diabetes mellitus without complication (HCC)    Eustachian tube dysfunction 04/25/2012   Hearing loss 03/15/2012   Hypertension    Personal history of chemotherapy    Personal history of radiation therapy    Psoriasis 11/18/2012   Rhinitis 03/15/2012   TM (tympanic membrane disorder) 03/15/2012    Family History: Family History  Problem Relation Age of Onset   Hyperlipidemia Mother    Dementia Mother    Diabetes Brother    Diabetes Maternal Grandmother    Cancer Paternal Grandmother     Social History   Socioeconomic History   Marital status: Widowed    Spouse name: Not on file   Number of children: Not on file   Years of education: Not on file   Highest education level: Not on file  Occupational History   Not on file  Tobacco Use   Smoking status: Former    Current packs/day: 0.00    Average packs/day: 1 pack/day for 25.0 years (25.0 ttl pk-yrs)    Types: Cigarettes    Start date: 10/19/1981    Quit date: 10/19/2006    Years since quitting: 17.2   Smokeless tobacco: Never  Vaping Use   Vaping status: Never Used  Substance and Sexual Activity   Alcohol use: Not Currently    Comment: ocassionally   Drug use: No   Sexual activity: Never  Other Topics Concern   Not on file  Social History Narrative   Not on file   Social Drivers of Health   Financial Resource Strain: Not on file  Food Insecurity: Not on file  Transportation Needs: Not on file  Physical Activity: Not on file  Stress: Not on file  Social Connections: Not on file  Intimate Partner Violence: Not on file      Review of Systems  Constitutional:  Negative for chills, diaphoresis and fatigue.  HENT:  Negative for ear pain, postnasal drip and sinus pressure.   Eyes:  Negative for photophobia, discharge, redness, itching and visual disturbance.  Respiratory:  Positive for cough. Negative for shortness of breath and wheezing.    Cardiovascular:  Negative for chest pain, palpitations and leg swelling.  Gastrointestinal:  Negative for abdominal pain, blood in stool, constipation, nausea and vomiting.  Genitourinary:  Negative for dysuria and flank pain.  Musculoskeletal:  Negative for arthralgias, back pain, gait problem and neck pain.  Skin:  Negative for color change.  Allergic/Immunologic: Negative for environmental allergies and food allergies.  Neurological:  Negative for dizziness and headaches.  Hematological:  Does not bruise/bleed easily.  Psychiatric/Behavioral:  Negative for agitation, behavioral problems (depression) and hallucinations.     Vital Signs: BP 128/80   Pulse 77   Temp 98.4 F (36.9 C)   Resp 16   Ht 5\' 7"  (1.702 m)   Wt 156 lb 9.6 oz (71 kg)   SpO2  98%   BMI 24.53 kg/m    Physical Exam Vitals and nursing note reviewed.  Constitutional:      General: She is not in acute distress.    Appearance: She is well-developed. She is not diaphoretic.  HENT:     Head: Normocephalic and atraumatic.  Neck:     Thyroid: No thyromegaly.     Vascular: No JVD.     Trachea: No tracheal deviation.  Cardiovascular:     Rate and Rhythm: Normal rate and regular rhythm.     Heart sounds: Normal heart sounds. No murmur heard.    No friction rub. No gallop.  Pulmonary:     Effort: Pulmonary effort is normal. No respiratory distress.     Breath sounds: No wheezing or rales.  Chest:     Chest wall: No tenderness.  Musculoskeletal:        General: Normal range of motion.     Cervical back: Normal range of motion and neck supple.  Lymphadenopathy:     Cervical: No cervical adenopathy.  Skin:    General: Skin is warm and dry.  Neurological:     Mental Status: She is alert and oriented to person, place, and time.     Cranial Nerves: No cranial nerve deficit.  Psychiatric:        Behavior: Behavior normal.        Thought Content: Thought content normal.        Judgment: Judgment normal.         Assessment/Plan: 1. TSH elevation (Primary) Resolved, will continue to monitor  2. Postprandial diarrhea Follow up with GI  3. Encounter for colorectal cancer screening - Cologuard  4. Visit for screening mammogram - MM 3D SCREENING MAMMOGRAM BILATERAL BREAST; Future   General Counseling: Kristie Bowman verbalizes understanding of the findings of todays visit and agrees with plan of treatment. I have discussed any further diagnostic evaluation that may be needed or ordered today. We also reviewed her medications today. she has been encouraged to call the office with any questions or concerns that should arise related to todays visit.    Orders Placed This Encounter  Procedures   MM 3D SCREENING MAMMOGRAM BILATERAL BREAST   Cologuard    No orders of the defined types were placed in this encounter.   This patient was seen by Lynn Ito, PA-C in collaboration with Dr. Beverely Risen as a part of collaborative care agreement.   Total time spent:30 Minutes Time spent includes review of chart, medications, test results, and follow up plan with the patient.      Dr Lyndon Code Internal medicine

## 2023-12-29 ENCOUNTER — Encounter (INDEPENDENT_AMBULATORY_CARE_PROVIDER_SITE_OTHER): Payer: Managed Care, Other (non HMO) | Admitting: Ophthalmology

## 2023-12-29 DIAGNOSIS — Z7984 Long term (current) use of oral hypoglycemic drugs: Secondary | ICD-10-CM

## 2023-12-29 DIAGNOSIS — I10 Essential (primary) hypertension: Secondary | ICD-10-CM

## 2023-12-29 DIAGNOSIS — E113393 Type 2 diabetes mellitus with moderate nonproliferative diabetic retinopathy without macular edema, bilateral: Secondary | ICD-10-CM

## 2023-12-29 DIAGNOSIS — H59033 Cystoid macular edema following cataract surgery, bilateral: Secondary | ICD-10-CM | POA: Diagnosis not present

## 2023-12-29 DIAGNOSIS — H35033 Hypertensive retinopathy, bilateral: Secondary | ICD-10-CM

## 2023-12-29 DIAGNOSIS — Z794 Long term (current) use of insulin: Secondary | ICD-10-CM

## 2023-12-29 DIAGNOSIS — H43813 Vitreous degeneration, bilateral: Secondary | ICD-10-CM

## 2024-03-29 ENCOUNTER — Encounter (INDEPENDENT_AMBULATORY_CARE_PROVIDER_SITE_OTHER): Admitting: Ophthalmology

## 2024-03-29 DIAGNOSIS — Z7984 Long term (current) use of oral hypoglycemic drugs: Secondary | ICD-10-CM | POA: Diagnosis not present

## 2024-03-29 DIAGNOSIS — H43813 Vitreous degeneration, bilateral: Secondary | ICD-10-CM

## 2024-03-29 DIAGNOSIS — I10 Essential (primary) hypertension: Secondary | ICD-10-CM

## 2024-03-29 DIAGNOSIS — Z794 Long term (current) use of insulin: Secondary | ICD-10-CM

## 2024-03-29 DIAGNOSIS — H59033 Cystoid macular edema following cataract surgery, bilateral: Secondary | ICD-10-CM

## 2024-03-29 DIAGNOSIS — E113293 Type 2 diabetes mellitus with mild nonproliferative diabetic retinopathy without macular edema, bilateral: Secondary | ICD-10-CM | POA: Diagnosis not present

## 2024-03-29 DIAGNOSIS — H35033 Hypertensive retinopathy, bilateral: Secondary | ICD-10-CM

## 2024-08-07 ENCOUNTER — Encounter (INDEPENDENT_AMBULATORY_CARE_PROVIDER_SITE_OTHER): Admitting: Ophthalmology

## 2024-09-04 ENCOUNTER — Encounter (INDEPENDENT_AMBULATORY_CARE_PROVIDER_SITE_OTHER): Admitting: Ophthalmology

## 2024-09-04 DIAGNOSIS — I10 Essential (primary) hypertension: Secondary | ICD-10-CM

## 2024-09-04 DIAGNOSIS — Z794 Long term (current) use of insulin: Secondary | ICD-10-CM | POA: Diagnosis not present

## 2024-09-04 DIAGNOSIS — Z7984 Long term (current) use of oral hypoglycemic drugs: Secondary | ICD-10-CM | POA: Diagnosis not present

## 2024-09-04 DIAGNOSIS — H59033 Cystoid macular edema following cataract surgery, bilateral: Secondary | ICD-10-CM

## 2024-09-04 DIAGNOSIS — H43813 Vitreous degeneration, bilateral: Secondary | ICD-10-CM

## 2024-09-04 DIAGNOSIS — H35033 Hypertensive retinopathy, bilateral: Secondary | ICD-10-CM

## 2024-09-04 DIAGNOSIS — E113313 Type 2 diabetes mellitus with moderate nonproliferative diabetic retinopathy with macular edema, bilateral: Secondary | ICD-10-CM

## 2024-09-21 ENCOUNTER — Telehealth: Payer: Self-pay | Admitting: Physician Assistant

## 2024-09-21 NOTE — Telephone Encounter (Signed)
 Left vm and sent mychart message to confirm 09/28/24 appointment-Toni

## 2024-09-28 ENCOUNTER — Encounter: Payer: Managed Care, Other (non HMO) | Admitting: Physician Assistant

## 2024-10-04 ENCOUNTER — Telehealth: Payer: Self-pay | Admitting: Physician Assistant

## 2024-10-04 NOTE — Telephone Encounter (Signed)
 Lvm & sent message to reschedule 09/28/2024 missed appointment-Toni

## 2024-10-24 ENCOUNTER — Telehealth: Payer: Self-pay | Admitting: Physician Assistant

## 2024-10-24 NOTE — Telephone Encounter (Signed)
 Left 2nd  vm regarding missed appointment-Toni

## 2024-11-22 ENCOUNTER — Telehealth: Payer: Self-pay | Admitting: Physician Assistant

## 2024-11-22 NOTE — Telephone Encounter (Signed)
 Left 3rd vm regarding missed appointment-Toni

## 2025-01-02 ENCOUNTER — Encounter (INDEPENDENT_AMBULATORY_CARE_PROVIDER_SITE_OTHER): Admitting: Ophthalmology
